# Patient Record
Sex: Male | Born: 1935 | Race: White | Hispanic: No | Marital: Married | Smoking: Never smoker
Health system: Southern US, Community
[De-identification: ages and names within clinical notes are randomized; demographics above are authoritative.]

## PROBLEM LIST (undated history)

## (undated) DIAGNOSIS — I495 Sick sinus syndrome: Secondary | ICD-10-CM

## (undated) DIAGNOSIS — I739 Peripheral vascular disease, unspecified: Secondary | ICD-10-CM

## (undated) DIAGNOSIS — I779 Disorder of arteries and arterioles, unspecified: Secondary | ICD-10-CM

## (undated) DIAGNOSIS — E119 Type 2 diabetes mellitus without complications: Secondary | ICD-10-CM

## (undated) DIAGNOSIS — M1990 Unspecified osteoarthritis, unspecified site: Secondary | ICD-10-CM

## (undated) DIAGNOSIS — I219 Acute myocardial infarction, unspecified: Secondary | ICD-10-CM

## (undated) DIAGNOSIS — I639 Cerebral infarction, unspecified: Secondary | ICD-10-CM

## (undated) DIAGNOSIS — I2510 Atherosclerotic heart disease of native coronary artery without angina pectoris: Secondary | ICD-10-CM

## (undated) DIAGNOSIS — I10 Essential (primary) hypertension: Secondary | ICD-10-CM

## (undated) DIAGNOSIS — I4891 Unspecified atrial fibrillation: Secondary | ICD-10-CM

## (undated) DIAGNOSIS — N189 Chronic kidney disease, unspecified: Secondary | ICD-10-CM

## (undated) HISTORY — PX: APPENDECTOMY: SHX54

## (undated) HISTORY — DX: Unspecified osteoarthritis, unspecified site: M19.90

## (undated) HISTORY — DX: Type 2 diabetes mellitus without complications: E11.9

## (undated) HISTORY — PX: CHOLECYSTECTOMY: SHX55

## (undated) HISTORY — PX: HERNIA REPAIR: SHX51

## (undated) HISTORY — DX: Essential (primary) hypertension: I10

## (undated) HISTORY — DX: Sick sinus syndrome: I49.5

## (undated) HISTORY — DX: Chronic kidney disease, unspecified: N18.9

## (undated) HISTORY — DX: Cerebral infarction, unspecified: I63.9

## (undated) HISTORY — DX: Disorder of arteries and arterioles, unspecified: I77.9

## (undated) HISTORY — DX: Unspecified atrial fibrillation: I48.91

## (undated) HISTORY — DX: Atherosclerotic heart disease of native coronary artery without angina pectoris: I25.10

## (undated) HISTORY — DX: Acute myocardial infarction, unspecified: I21.9

## (undated) HISTORY — DX: Peripheral vascular disease, unspecified: I73.9

---

## 1998-06-16 ENCOUNTER — Ambulatory Visit (HOSPITAL_BASED_OUTPATIENT_CLINIC_OR_DEPARTMENT_OTHER): Admission: RE | Admit: 1998-06-16 | Discharge: 1998-06-16 | Payer: Self-pay | Admitting: Orthopedic Surgery

## 1999-01-18 HISTORY — PX: CORONARY ARTERY BYPASS GRAFT: SHX141

## 2007-10-30 ENCOUNTER — Ambulatory Visit: Payer: Self-pay | Admitting: Cardiology

## 2008-05-19 ENCOUNTER — Ambulatory Visit: Payer: Self-pay | Admitting: Cardiology

## 2008-10-20 DIAGNOSIS — I4891 Unspecified atrial fibrillation: Secondary | ICD-10-CM

## 2008-10-20 DIAGNOSIS — I2510 Atherosclerotic heart disease of native coronary artery without angina pectoris: Secondary | ICD-10-CM | POA: Insufficient documentation

## 2008-10-20 DIAGNOSIS — E785 Hyperlipidemia, unspecified: Secondary | ICD-10-CM | POA: Insufficient documentation

## 2009-01-17 HISTORY — PX: PACEMAKER PLACEMENT: SHX43

## 2009-04-29 ENCOUNTER — Encounter: Payer: Self-pay | Admitting: Cardiology

## 2009-04-29 ENCOUNTER — Ambulatory Visit: Payer: Self-pay | Admitting: Cardiology

## 2009-04-29 ENCOUNTER — Inpatient Hospital Stay (HOSPITAL_COMMUNITY): Admission: EM | Admit: 2009-04-29 | Discharge: 2009-05-02 | Payer: Self-pay | Admitting: Internal Medicine

## 2009-04-30 ENCOUNTER — Encounter: Payer: Self-pay | Admitting: Cardiology

## 2009-05-02 ENCOUNTER — Encounter: Payer: Self-pay | Admitting: Internal Medicine

## 2009-05-05 ENCOUNTER — Encounter: Payer: Self-pay | Admitting: Internal Medicine

## 2009-05-06 ENCOUNTER — Telehealth: Payer: Self-pay | Admitting: Internal Medicine

## 2009-05-12 ENCOUNTER — Telehealth (INDEPENDENT_AMBULATORY_CARE_PROVIDER_SITE_OTHER): Payer: Self-pay | Admitting: *Deleted

## 2009-05-13 ENCOUNTER — Encounter: Payer: Self-pay | Admitting: Internal Medicine

## 2009-05-13 ENCOUNTER — Ambulatory Visit: Payer: Self-pay

## 2009-06-12 ENCOUNTER — Ambulatory Visit: Payer: Self-pay | Admitting: Cardiology

## 2009-06-12 DIAGNOSIS — R0989 Other specified symptoms and signs involving the circulatory and respiratory systems: Secondary | ICD-10-CM

## 2009-06-18 ENCOUNTER — Encounter: Payer: Self-pay | Admitting: Physician Assistant

## 2009-06-23 DIAGNOSIS — I6529 Occlusion and stenosis of unspecified carotid artery: Secondary | ICD-10-CM

## 2009-06-29 ENCOUNTER — Ambulatory Visit: Payer: Self-pay | Admitting: Vascular Surgery

## 2009-06-29 ENCOUNTER — Encounter: Payer: Self-pay | Admitting: Physician Assistant

## 2009-09-14 ENCOUNTER — Telehealth (INDEPENDENT_AMBULATORY_CARE_PROVIDER_SITE_OTHER): Payer: Self-pay | Admitting: *Deleted

## 2009-09-18 ENCOUNTER — Ambulatory Visit: Payer: Self-pay | Admitting: Internal Medicine

## 2009-09-18 DIAGNOSIS — I10 Essential (primary) hypertension: Secondary | ICD-10-CM

## 2009-09-18 DIAGNOSIS — I495 Sick sinus syndrome: Secondary | ICD-10-CM

## 2009-09-24 ENCOUNTER — Ambulatory Visit: Payer: Self-pay | Admitting: Cardiology

## 2009-11-15 ENCOUNTER — Encounter: Payer: Self-pay | Admitting: Cardiology

## 2009-11-16 ENCOUNTER — Ambulatory Visit: Payer: Self-pay | Admitting: Cardiology

## 2010-02-03 ENCOUNTER — Telehealth: Payer: Self-pay | Admitting: Cardiology

## 2010-02-04 ENCOUNTER — Encounter: Payer: Self-pay | Admitting: Internal Medicine

## 2010-02-04 ENCOUNTER — Inpatient Hospital Stay (HOSPITAL_COMMUNITY)
Admission: AD | Admit: 2010-02-04 | Discharge: 2010-02-10 | Disposition: A | Discharge status: Rehabilitation Facility | Payer: Self-pay | Attending: Internal Medicine | Admitting: Internal Medicine

## 2010-02-08 LAB — GLUCOSE, CAPILLARY
Glucose-Capillary: 138 mg/dL — ABNORMAL HIGH (ref 70–99)
Glucose-Capillary: 127 mg/dL — ABNORMAL HIGH (ref 70–99)
Glucose-Capillary: 226 mg/dL — ABNORMAL HIGH (ref 70–99)
Glucose-Capillary: 231 mg/dL — ABNORMAL HIGH (ref 70–99)
Glucose-Capillary: 307 mg/dL — ABNORMAL HIGH (ref 70–99)

## 2010-02-08 LAB — PROTIME-INR
INR: 1.09 (ref 0.00–1.49)
INR: 1.21 (ref 0.00–1.49)
Prothrombin Time: 14.3 seconds (ref 11.6–15.2)
Prothrombin Time: 15.5 seconds — ABNORMAL HIGH (ref 11.6–15.2)

## 2010-02-08 LAB — CBC
HCT: 36.8 % — ABNORMAL LOW (ref 39.0–52.0)
MCV: 89.3 fL (ref 78.0–100.0)
Platelets: 166 10*3/uL (ref 150–400)
RBC: 4.12 MIL/uL — ABNORMAL LOW (ref 4.22–5.81)
WBC: 9.1 10*3/uL (ref 4.0–10.5)

## 2010-02-08 LAB — LIPID PANEL
Cholesterol: 122 mg/dL (ref 0–200)
LDL Cholesterol: 63 mg/dL (ref 0–99)

## 2010-02-08 LAB — HEMOGLOBIN A1C: Hgb A1c MFr Bld: 9.3 % — ABNORMAL HIGH (ref <5.7)

## 2010-02-08 LAB — CARDIAC PANEL(CRET KIN+CKTOT+MB+TROPI): CK, MB: 2.9 ng/mL (ref 0.3–4.0)

## 2010-02-08 LAB — COMPREHENSIVE METABOLIC PANEL
CO2: 23 mEq/L (ref 19–32)
Calcium: 8.7 mg/dL (ref 8.4–10.5)
Chloride: 101 mEq/L (ref 96–112)
Creatinine, Ser: 1.36 mg/dL (ref 0.4–1.5)
GFR calc Af Amer: >60 mL/min (ref >60)
Potassium: 3.9 mEq/L (ref 3.5–5.1)

## 2010-02-09 LAB — GLUCOSE, CAPILLARY
Glucose-Capillary: 202 mg/dL — ABNORMAL HIGH (ref 70–99)
Glucose-Capillary: 216 mg/dL — ABNORMAL HIGH (ref 70–99)
Glucose-Capillary: 221 mg/dL — ABNORMAL HIGH (ref 70–99)
Glucose-Capillary: 363 mg/dL — ABNORMAL HIGH (ref 70–99)
Glucose-Capillary: 273 mg/dL — ABNORMAL HIGH (ref 70–99)
Glucose-Capillary: 271 mg/dL — ABNORMAL HIGH (ref 70–99)

## 2010-02-09 LAB — BASIC METABOLIC PANEL
CO2: 26 mEq/L (ref 19–32)
GFR calc Af Amer: >60 mL/min (ref >60)
GFR calc non Af Amer: 54 mL/min — ABNORMAL LOW (ref >60)
Glucose, Bld: 182 mg/dL — ABNORMAL HIGH (ref 70–99)
Potassium: 4.3 mEq/L (ref 3.5–5.1)
Sodium: 135 mEq/L (ref 135–145)

## 2010-02-09 LAB — PROTIME-INR: Prothrombin Time: 15.4 seconds — ABNORMAL HIGH (ref 11.6–15.2)

## 2010-02-10 ENCOUNTER — Inpatient Hospital Stay (HOSPITAL_COMMUNITY)
Admission: RE | Admit: 2010-02-10 | Discharge: 2010-02-25 | DRG: 945 | Disposition: A | Discharge status: Rehabilitation Facility | Payer: Medicare (Managed Care) | Source: Other Acute Inpatient Hospital | Attending: Physical Medicine & Rehabilitation | Admitting: Physical Medicine & Rehabilitation

## 2010-02-10 DIAGNOSIS — E119 Type 2 diabetes mellitus without complications: Secondary | ICD-10-CM | POA: Diagnosis present

## 2010-02-10 DIAGNOSIS — Z5189 Encounter for other specified aftercare: Principal | ICD-10-CM

## 2010-02-10 DIAGNOSIS — G8190 Hemiplegia, unspecified affecting unspecified side: Secondary | ICD-10-CM | POA: Diagnosis present

## 2010-02-10 DIAGNOSIS — I4891 Unspecified atrial fibrillation: Secondary | ICD-10-CM | POA: Diagnosis present

## 2010-02-10 DIAGNOSIS — I2510 Atherosclerotic heart disease of native coronary artery without angina pectoris: Secondary | ICD-10-CM | POA: Diagnosis present

## 2010-02-10 DIAGNOSIS — E785 Hyperlipidemia, unspecified: Secondary | ICD-10-CM | POA: Diagnosis present

## 2010-02-10 DIAGNOSIS — I6350 Cerebral infarction due to unspecified occlusion or stenosis of unspecified cerebral artery: Secondary | ICD-10-CM | POA: Diagnosis present

## 2010-02-10 DIAGNOSIS — R4701 Aphasia: Secondary | ICD-10-CM | POA: Diagnosis present

## 2010-02-10 DIAGNOSIS — Z951 Presence of aortocoronary bypass graft: Secondary | ICD-10-CM

## 2010-02-10 LAB — BASIC METABOLIC PANEL
BUN: 21 mg/dL (ref 6–23)
CO2: 26 mEq/L (ref 19–32)
Calcium: 8.7 mg/dL (ref 8.4–10.5)
Chloride: 103 mEq/L (ref 96–112)
Creatinine, Ser: 1.45 mg/dL (ref 0.4–1.5)
GFR calc Af Amer: 56 mL/min — ABNORMAL LOW (ref >60)

## 2010-02-10 LAB — GLUCOSE, CAPILLARY
Glucose-Capillary: 351 mg/dL — ABNORMAL HIGH (ref 70–99)
Glucose-Capillary: 180 mg/dL — ABNORMAL HIGH (ref 70–99)
Glucose-Capillary: 233 mg/dL — ABNORMAL HIGH (ref 70–99)

## 2010-02-10 LAB — PROTIME-INR: Prothrombin Time: 18.3 seconds — ABNORMAL HIGH (ref 11.6–15.2)

## 2010-02-11 LAB — CBC
HCT: 38.3 % — ABNORMAL LOW (ref 39.0–52.0)
MCV: 89.5 fL (ref 78.0–100.0)
Platelets: 234 10*3/uL (ref 150–400)
RBC: 4.28 MIL/uL (ref 4.22–5.81)
WBC: 7.5 10*3/uL (ref 4.0–10.5)

## 2010-02-11 LAB — COMPREHENSIVE METABOLIC PANEL
Albumin: 3.2 g/dL — ABNORMAL LOW (ref 3.5–5.2)
Alkaline Phosphatase: 54 U/L (ref 39–117)
BUN: 23 mg/dL (ref 6–23)
Chloride: 97 mEq/L (ref 96–112)
Potassium: 4.4 mEq/L (ref 3.5–5.1)
Total Bilirubin: 0.6 mg/dL (ref 0.3–1.2)

## 2010-02-11 LAB — PROTIME-INR: INR: 1.73 — ABNORMAL HIGH (ref 0.00–1.49)

## 2010-02-11 LAB — DIFFERENTIAL
Lymphocytes Relative: 19 % (ref 12–46)
Lymphs Abs: 1.4 10*3/uL (ref 0.7–4.0)
Neutrophils Relative %: 67 % (ref 43–77)

## 2010-02-11 LAB — GLUCOSE, CAPILLARY: Glucose-Capillary: 312 mg/dL — ABNORMAL HIGH (ref 70–99)

## 2010-02-12 LAB — GLUCOSE, CAPILLARY
Glucose-Capillary: 451 mg/dL — ABNORMAL HIGH (ref 70–99)
Glucose-Capillary: 475 mg/dL — ABNORMAL HIGH (ref 70–99)
Glucose-Capillary: 123 mg/dL — ABNORMAL HIGH (ref 70–99)

## 2010-02-13 LAB — GLUCOSE, CAPILLARY
Glucose-Capillary: 124 mg/dL — ABNORMAL HIGH (ref 70–99)
Glucose-Capillary: 219 mg/dL — ABNORMAL HIGH (ref 70–99)
Glucose-Capillary: 142 mg/dL — ABNORMAL HIGH (ref 70–99)

## 2010-02-13 LAB — PROTIME-INR: Prothrombin Time: 25.8 seconds — ABNORMAL HIGH (ref 11.6–15.2)

## 2010-02-14 LAB — PROTIME-INR: INR: 2.06 — ABNORMAL HIGH (ref 0.00–1.49)

## 2010-02-14 LAB — GLUCOSE, CAPILLARY
Glucose-Capillary: 247 mg/dL — ABNORMAL HIGH (ref 70–99)
Glucose-Capillary: 275 mg/dL — ABNORMAL HIGH (ref 70–99)

## 2010-02-15 LAB — PROTIME-INR: INR: 2.41 — ABNORMAL HIGH (ref 0.00–1.49)

## 2010-02-15 LAB — GLUCOSE, CAPILLARY: Glucose-Capillary: 292 mg/dL — ABNORMAL HIGH (ref 70–99)

## 2010-02-16 ENCOUNTER — Ambulatory Visit: Admit: 2010-02-16 | Payer: Self-pay | Admitting: Vascular Surgery

## 2010-02-16 LAB — BASIC METABOLIC PANEL
BUN: 31 mg/dL — ABNORMAL HIGH (ref 6–23)
Calcium: 9 mg/dL (ref 8.4–10.5)
Creatinine, Ser: 1.65 mg/dL — ABNORMAL HIGH (ref 0.4–1.5)
GFR calc Af Amer: 48 mL/min — ABNORMAL LOW (ref >60)
GFR calc non Af Amer: 40 mL/min — ABNORMAL LOW (ref >60)

## 2010-02-16 LAB — PROTIME-INR
INR: 2.47 — ABNORMAL HIGH (ref 0.00–1.49)
Prothrombin Time: 26.9 seconds — ABNORMAL HIGH (ref 11.6–15.2)

## 2010-02-16 LAB — GLUCOSE, CAPILLARY: Glucose-Capillary: 98 mg/dL (ref 70–99)

## 2010-02-16 NOTE — Miscellaneous (Signed)
Summary: Device preload  Clinical Lists Changes  Observations: Added new observation of PPM INDICATN: A-fib (05/05/2009 16:41) Added new observation of MAGNET RTE: BOL 100 ERI  85 (05/05/2009 16:41) Added new observation of PPMLEADSTAT1: active (05/05/2009 16:41) Added new observation of PPMLEADSER1: [id number] (05/05/2009 16:41) Added new observation of PPMLEADMOD1: 1888TC (05/05/2009 16:41) Added new observation of PPMLEADLOC1: RV (05/05/2009 16:41) Added new observation of PPM IMP MD: Hillis Range, MD (05/05/2009 16:41) Added new observation of PPMLEADDOI1: 05/01/2009 (05/05/2009 16:41) Added new observation of PPM DOI: 05/01/2009 (05/05/2009 16:41) Added new observation of PPM SERL#: [id number]  (05/05/2009 16:41) Added new observation of PPM MODL#: [id number]  (05/05/2009 56:21) Added new observation of PACEMAKERMFG: [employer] Jude  (05/05/2009 16:41) Added new observation of PACEMAKER MD: Hillis Range, MD  (05/05/2009 16:41)      PPM Specifications Following MD:  Hillis Range, MD     PPM Vendor:  [employer] Jude     PPM Model Number:  [id number]     PPM Serial Number:  [id number] PPM DOI:  05/01/2009     PPM Implanting MD:  Hillis Range, MD  Lead 1    Location: RV     DOI: 05/01/2009     Model #: [id number]     Serial #: [id number]     Status: active  Magnet Response Rate:  BOL 100 ERI  85  Indications:  A-fib

## 2010-02-16 NOTE — Assessment & Plan Note (Signed)
Summary: 6 MO FU PER AUG REMINDER   Visit Type:  Follow-up Primary Provider:  Dr. Wyvonnia Lora   History of Present Illness: 77 year old male presents for followup. He was seen for routine followup in May of this year. Most recently the patient had a device followup with Dr. Johney Frame in September at which time atrial fibrillation was noted, rate controlled. Discussion regarding anticoagulation with had at that point with CHADS2 score of 3, and the patient declined Coumadin. Otherwise pacemaker function was stable.  He denies any problems with chest pain or unusual breathlessness. Continues to enjoy outdoors activity in his garden.  Simvastatin was cut back to 40 mg daily based on FDA recommendations, and also with some complaints of calf discomfort. He perhaps has had some improvement, although it is still early after this adjustment.  No sense of palpitations. He had no further questions regarding atrial fibrillation and anticoagulation. He does not want to start Coumadin.   Clinical Review Panels:  Cardiac Imaging Cardiac Cath Findings  ANGIOGRAPHIC DATA:   1. The left main is free of critical disease.   2. The LAD is somewhat diffusely diseased and then appears to be       severely diseased at the bifurcation of the small diagonal and then       the LAD.  No further distal vessel was seen.  This is after a       septal perforator.   3. The internal mammary to the distal LAD is in fact intact.  The       distal LAD is a very small-caliber vessel, diffusely diseased, and       does appear to provide flow to the posterior descending.  It is a       very small caliber diffusely diseased vessel.   4. The circumflex is a moderately large vessel.  There is a tiny first       marginal that has probably 50-70% proximal segmental plaque but is       small in caliber.  Following this, the vessel then opens up and       provides a bifurcating marginal with a very small-caliber vessel       and  a subbranch with about 90% narrowing.  The third marginal is       essentially occluded.   5. The saphenous vein graft what appears to be the third marginal is       intact.  It was described as a diagonal OM graft, and the diagonal       takeoff is not seen.  The actual diagonal may represent the       diagonal seen on the native left injection, but this is unknown       since the old films are not presently available.   6. The right coronary artery is somewhat diffusely diseased vessel       with 50% narrowing and then small calibered distally going into the       posterolateral segment which is extremely small and subtotally       occluded.  The PDA itself is not seen.  We believe we see the PDA       slowly filling in by retrograde collaterals from the distal LAD       injection.   7. Ventriculography in the RAO projection reveals vigorous global       systolic function with ejection fraction in excess of 60%.   8. Aortic  root aortography reveals no aortic regurgitation.  The       aortic size may be minimally dilated but appears overall to be at       most upper normal in size. (05/01/2009)    Current Medications (verified): 1)  Coreg 3.125 Mg Tabs (Carvedilol) .... Take 1 Tablet By Mouth Twice A Day 2)  Nitrostat 0.4 Mg Subl (Nitroglycerin) .Marland Kitchen.. 1 Tablet Under Tongue At Onset of Chest Pain; You May Repeat Every 5 Minutes For Up To 3 Doses. 3)  Aspirin Ec 325 Mg Tbec (Aspirin) .... Take One Tablet By Mouth Daily 4)  Glipizide 5 Mg Tabs (Glipizide) .... Take 1 Tablet By Mouth Once A Day 5)  Lisinopril 20 Mg Tabs (Lisinopril) .... Take 1 Tablet By Mouth Once A Day 6)  Nifedipine 30 Mg Xr24h-Tab (Nifedipine) .... Take 1 Tablet By Mouth Once A Day 7)  Simvastatin 40 Mg Tabs (Simvastatin) .... Take One Tablet By Mouth Daily At Bedtime  Allergies (verified): 1)  ! * Plavix  Past History:  Past Medical History: Last updated: 06/08/2009 CAD - multivessel, LVEF  60% Hypertension Myocardial Infarction - NSTEMI Atrial Fibrillation - refuses Coumadin Hearing loss Diabetes Type 2 Sick sinus syndrome - s/p PPM  Past Surgical History: Last updated: 06/08/2009 Hernia repair Appendectomy CABG 2001 - LIMA to LAD, SVG to OM, SVG to PDA Cholecystectomy  [facility] [employer] RF SR model [phone] - 4/11  Social History: Last updated: 06/08/2009 Married  Tobacco Use - No Alcohol Use - no Regular Exercise - yes Drug Use - no Full Time - minister/ evangelist  Review of Systems  The patient denies anorexia, fever, chest pain, syncope, dyspnea on exertion, peripheral edema, melena, and hematochezia.         Otherwise reviewed and negative except as outlined.  Vital Signs:  Patient profile:   77 year old male Height:      66 inches Weight:      160 pounds BMI:     25.92 Pulse rate:   61 / minute Resp:     16 per minute BP sitting:   175 / 83  (left arm)  Vitals Entered By: Marrion Coy, CNA (September 24, 2009 11:18 AM)  Physical Exam  Additional Exam:  GEN:77 year old male, sitting upright, in no distress HEENT: NCAT,PERRLA,EOMI NECK: palpable pulses; high pitched left sided bruit; no JVD; no TM LUNGS: CTA bilaterally HEART: RRR (S1S2); no significant murmurs; no rubs; no gallops ABD: soft, NT; intact BS EXT: intact right femoral pulse, no bruit; trace pedal edema SKIN: warm, dry MUSC: no obvious deformity NEURO: A/O (x3)    PPM Specifications Following MD:  Hillis Range, MD     PPM Vendor:  [employer] Jude     PPM Model Number:  [id number]     PPM Serial Number:  [id number] PPM DOI:  05/01/2009     PPM Implanting MD:  Hillis Range, MD  Lead 1    Location: RV     DOI: 05/01/2009     Model #: [id number]     Serial #: [id number]     Status: active  Magnet Response Rate:  BOL 100 ERI  85  Indications:  A-fib   PPM Follow Up Pacer Dependent:  No      Parameters Mode:  VVIR     Lower Rate Limit:  60     Upper Rate Limit:  110  Impression &  Recommendations:  Problem # 1:  CAD, NATIVE VESSEL (ICD-414.01)  Symptomatically stable on medical therapy. Most recent angiogram is reviewed above. Plan to continue observation and arrange followup in 6 months.  His updated medication list for this problem includes:    Coreg 3.125 Mg Tabs (Carvedilol) .Marland Kitchen... Take 1 tablet by mouth twice a day    Nitrostat 0.4 Mg Subl (Nitroglycerin) .Marland Kitchen... 1 tablet under tongue at onset of chest pain; you may repeat every 5 minutes for up to 3 doses.    Aspirin Ec 325 Mg Tbec (Aspirin) .Marland Kitchen... Take one tablet by mouth daily    Lisinopril 20 Mg Tabs (Lisinopril) .Marland Kitchen... Take 1 tablet by mouth once a day    Nifedipine 30 Mg Xr24h-tab (Nifedipine) .Marland Kitchen... Take 1 tablet by mouth once a day  Problem # 2:  ATRIAL FIBRILLATION (ICD-427.31)  As noted above, patient does not want to initiate anticoagulation therapy. He continues on aspirin.  His updated medication list for this problem includes:    Coreg 3.125 Mg Tabs (Carvedilol) .Marland Kitchen... Take 1 tablet by mouth twice a day    Aspirin Ec 325 Mg Tbec (Aspirin) .Marland Kitchen... Take one tablet by mouth daily  Problem # 3:  HYPERLIPIDEMIA-MIXED (ICD-272.4)  Patient on lower dose simvastatin. Would recommend a followup fasting lipid profile and liver function tests over the next 8-12 weeks in follow up with Dr. Margo Common. If LDL is suboptimally controlled, may need to consider switching to a different statin medication.  His updated medication list for this problem includes:    Simvastatin 40 Mg Tabs (Simvastatin) .Marland Kitchen... Take one tablet by mouth daily at bedtime  Problem # 4:  ESSENTIAL HYPERTENSION, BENIGN (ICD-401.1)  Blood pressure elevated today. Due for follow up with Dr. Margo Common this month. May need further medication adjustments.  His updated medication list for this problem includes:    Coreg 3.125 Mg Tabs (Carvedilol) .Marland Kitchen... Take 1 tablet by mouth twice a day    Aspirin Ec 325 Mg Tbec (Aspirin) .Marland Kitchen... Take one tablet by mouth  daily    Lisinopril 20 Mg Tabs (Lisinopril) .Marland Kitchen... Take 1 tablet by mouth once a day    Nifedipine 30 Mg Xr24h-tab (Nifedipine) .Marland Kitchen... Take 1 tablet by mouth once a day  Patient Instructions: 1)  Your physician wants you to follow-up in: 6 months. You will receive a reminder letter in the mail one-two months in advance. If you don't receive a letter, please call our office to schedule the follow-up appointment. 2)  Your physician recommends that you continue on your current medications as directed. Please refer to the Current Medication list given to you today.

## 2010-02-16 NOTE — Progress Notes (Signed)
Summary: ? plavix reaction   Phone Note Other Incoming   Caller: wife - Debarah Crape  Summary of Call: Concerned that husband may be having reaction to Plavix.  Started on 4/23 - Plavix.  Since then blurry vision - mostly in afternoon, slight confusion, weakness in the legs, and dizziness.  Blood sugars are running 150-170.  150 is pretty average for him.  BP 150/70.  HR 60.  Please advise.  Hoover Brunette, LPN  May 12, 2009 10:31 AM   Follow-up for Phone Call        I have not seen patient recently.  I reviewed the recent hospital stay, catheterization report, and pacemaker report.  He did not undergo intervention.  Plavix apparently being used for minor NSTEMI with mulitivessel CAD.  Patient call to [facility] office also noted regarding rash on buttocks.  Not clear that these issue are related to Plavix, however since no PCI and this is only new medication, would stop it for now.  I presume he has an office visit scheduled - check on this please. Follow-up by: Loreli Slot, MD, [facility],  May 12, 2009 10:44 AM  Additional Follow-up for Phone Call Additional follow up Details #1::        Pt's wife notified to stop Plavix for now. Pt was scheduled follow up appt on Tuesday, May 3rd at 3:40, but pt's wife called to reschedule today b/c she won't be able to be with him this day. Appt is now scheduled for May 27th at 3pm. Additional Follow-up by: Cyril Loosen, RN, BSN,  May 12, 2009 4:50 PM

## 2010-02-16 NOTE — Progress Notes (Signed)
Summary: Concerned about BP  Phone Note Call from Patient Call back at Home Phone [phone]   Summary of Call: Male called for pt stating he was working in the yard. He came inside and he c/o weakness. She states his blood pressure ranged from 107-119/45-56. After resting his blood pressure was 128/65. He has a pacemaker so his HR remained around 60. He feels better now per caller. Notified that lower BP may be a result of just coming inside after working out in the heat. Notified to monitor BP and notify office if significantly lower BP with symptoms. Caller verbalized understanding. Initial call taken by: Cyril Loosen, RN, BSN,  September 14, 2009 4:07 PM

## 2010-02-16 NOTE — Assessment & Plan Note (Signed)
Summary: 3 MO F/U PER 4/27 WOUND CHECK-JM   Visit Type:  Pacemaker check Primary Provider:  Dr. Wyvonnia Lora   History of Present Illness: The patient presents today for routine electrophysiology followup. He reports doing very well since last being seen in our clinic. The patient denies symptoms of palpitations, chest pain, shortness of breath, orthopnea, PND, lower extremity edema, dizziness, presyncope, syncope, or neurologic sequela. The patient is tolerating medications without difficulties and is otherwise without complaint today.   Preventive Screening-Counseling & Management  Alcohol-Tobacco     Smoking Status: never  Current Medications (verified): 1)  Coreg 3.125 Mg Tabs (Carvedilol) .... Take 1 Tablet By Mouth Twice A Day 2)  Nitrostat 0.4 Mg Subl (Nitroglycerin) .Marland Kitchen.. 1 Tablet Under Tongue At Onset of Chest Pain; You May Repeat Every 5 Minutes For Up To 3 Doses. 3)  Aspirin Ec 325 Mg Tbec (Aspirin) .... Take One Tablet By Mouth Daily 4)  Glipizide 5 Mg Tabs (Glipizide) .... Take 1 Tablet By Mouth Once A Day 5)  Lisinopril 20 Mg Tabs (Lisinopril) .... Take 1 Tablet By Mouth Once A Day 6)  Nifedipine 30 Mg Xr24h-Tab (Nifedipine) .... Take 1 Tablet By Mouth Once A Day 7)  Simvastatin 80 Mg Tabs (Simvastatin) .... Take 1 Tablet By Mouth Once A Day  Allergies: 1)  ! * Plavix  Comments:  Nurse/Medical Assistant: The patient is currently on medications but does not know the name or dosage at this time. Instructed to contact our office with details. Will update medication list at that time.  Past History:  Past Medical History: Reviewed history from 06/08/2009 and no changes required. CAD - multivessel, LVEF 60% Hypertension Myocardial Infarction - NSTEMI Atrial Fibrillation - refuses Coumadin Hearing loss Diabetes Type 2 Sick sinus syndrome - s/p PPM  Past Surgical History: Reviewed history from 06/08/2009 and no changes required. Hernia repair Appendectomy CABG  2001 - LIMA to LAD, SVG to OM, SVG to PDA Cholecystectomy [facility] [employer] RF SR model [id number] - 4/11  Social History: Reviewed history from 06/08/2009 and no changes required. Married  Tobacco Use - No Alcohol Use - no Regular Exercise - yes Drug Use - no Full Time - minister/ evangelist  Review of Systems       All systems are reviewed and negative except as listed in the HPI.   Vital Signs:  Patient profile:   77 year old male Height:      66 inches Weight:      163 pounds Pulse rate:   65 / minute BP sitting:   177 / 94  (left arm) Cuff size:   regular  Vitals Entered By: Carlye Grippe (September 18, 2009 1:22 PM)  Physical Exam  General:  Well developed, well nourished, in no acute distress. Head:  normocephalic and atraumatic Eyes:  PERRLA/EOM intact; conjunctiva and lids normal. Mouth:  Teeth, gums and palate normal. Oral mucosa normal. Neck:  supple Chest Wall:  pacemaker site is well healed Lungs:  Clear bilaterally to auscultation and percussion. Heart:  RRR(paced) Abdomen:  Bowel sounds positive; abdomen soft and non-tender without masses, organomegaly, or hernias noted. No hepatosplenomegaly. Msk:  Back normal, normal gait. Muscle strength and tone normal. Pulses:  pulses normal in all 4 extremities Extremities:  No clubbing or cyanosis. Neurologic:  Alert and oriented x 3.   PPM Specifications Following MD:  Hillis Range, MD     PPM Vendor:  [employer] Jude     PPM Model Number:  [id number]     PPM Serial Number:  [id number] PPM DOI:  05/01/2009     PPM Implanting MD:  Hillis Range, MD  Lead 1    Location: RV     DOI: 05/01/2009     Model #: [id number]     Serial #: [id number]     Status: active  Magnet Response Rate:  BOL 100 ERI  85  Indications:  A-fib   PPM Follow Up Battery Voltage:  2.98 V     Battery Est. Longevity:  11.0-12.6 yrs     Pacer Dependent:  No     Right Ventricle  Amplitude: 12.0 mV, Impedance: 450 ohms, Threshold: 0.5 V at 0.4  msec  Episodes MS Episodes:  0     Ventricular High Rate:  0     Ventricular Pacing:  83%  Parameters Mode:  VVIR     Lower Rate Limit:  60     Upper Rate Limit:  110 Next Cardiology Appt Due:  03/18/2010 Tech Comments:  NORMAL DEVICE FUNCTION.  NO EPISODES SINCE LAST CHECK.  NO CHANGES MADE. ROV IN 6 MTHS W/DEVICE CLINIC. PT UNABLE TO BE ENROLLED IN [place] --NO LANDLINE PHONE.  Vella Kohler  September 18, 2009 1:33 PM MD Comments:  agree  Impression & Recommendations:  Problem # 1:  ATRIAL FIBRILLATION (ICD-427.31) stable, rate controlled I had a long discussion with the patient today regarding his elevated stroke risks and indications for Coumadin vs pradaxa (his CHADS2 score is 3).  He has taken coumadin previously.  He understands his risks and is very clear in his decision to decline coumadin at this time. Continue ASA 325mg  daily.  Problem # 2:  HYPERLIPIDEMIA-MIXED (ICD-272.4) per FDAs recommendation, we will decrease simvastatin to 40mg  qhs  Problem # 3:  CAD, NATIVE VESSEL (ICD-414.01) no symptoms of ischemia  Problem # 4:  BRADYCARDIA (ICD-427.89) improved s/p PPM changes as above  Problem # 5:  ESSENTIAL HYPERTENSION, BENIGN (ICD-401.1) above goal declines medicine changes today salt restriction advised  Patient Instructions: 1)  Decrease simvastatin to 40mg  by mouth at bedtime.  2)  Your physician wants you to follow-up in: 6 months. You will receive a reminder letter in the mail one-two months in advance. If you don't receive a letter, please call our office to schedule the follow-up appointment.

## 2010-02-16 NOTE — Procedures (Signed)
Summary: wch/per spouse call/lg   PPM Specifications Following MD:  Hillis Range, MD     PPM Vendor:  [employer] Jude     PPM Model Number:  [id number]     PPM Serial Number:  [id number] PPM DOI:  05/01/2009     PPM Implanting MD:  Hillis Range, MD  Lead 1    Location: RV     DOI: 05/01/2009     Model #: 1888TC     Serial #: [id number]     Status: active  Magnet Response Rate:  BOL 100 ERI  85  Indications:  A-fib   PPM Follow Up Remote Check?  No Battery Voltage:  2.99 V     Battery Est. Longevity:  7.2-7.9 YRS     Pacer Dependent:  No     Right Ventricle  Amplitude: 12.0 mV, Impedance: 410 ohms, Threshold: 0.5 V at 0.4 msec  Episodes MS Episodes:  0     Ventricular High Rate:  0     Ventricular Pacing:  96%  Parameters Mode:  VVIR     Lower Rate Limit:  60     Upper Rate Limit:  110 Next Cardiology Appt Due:  08/14/2009 Tech Comments:  NORMAL DEVICE FUNCTION.  CHANGES: TURNED ON V AUTOCAPTURE AND ACTIVITY THRESHOLD  FROM AUTO (+0.0) TO AUTO (-0.5).  ROV W/DR Bronsyn Shappell IN [place]. Gypsy Balsam RN BSN  May 13, 2009 3:45 PM

## 2010-02-16 NOTE — Cardiovascular Report (Signed)
Summary: [facility]/ [facility] SUMMARY  [facility]/ [facility] SUMMARY   Imported By: Dorise Hiss 09/24/2009 10:27:11  _____________________________________________________________________  External Attachment:    Type:   Image     Comment:   External Document

## 2010-02-16 NOTE — Assessment & Plan Note (Signed)
Summary: weakness, sob  --agh   Visit Type:  Follow-up Primary Provider:  Dr. Wyvonnia Lora   History of Present Illness: 77 year old male presents for follow-up. He was admitted to [facility] in April with unstable angina/NSTEMI and symptomatic bradycardia. Cardiac catherterization is noted below, and medical therapy was recommended. He was seen by Dr. Johney Frame with findings of PAF and SSS resulting in pacemaker placement. Plavix was continued, the patient refusing to consider Coumadin (as also noted in the past).  Shortly following his release, the patient developed a significant rash. Dr. Diona Browner was apprised of this, and recommendation was to discontinue Plavix. According to the patient's wife, this resulted in complete resolution of the rash. He remains on low dose aspirin.  The patient has had scheduled followup with our pacer clinic in [place], for initial wound check. This apparently has healed nicely. He is to return here to the [facility] clinic, to follow with Dr. Johney Frame.  Clinically, the patient denies any chest discomfort or significant exertional dyspnea. His wife recalls that he complained of "indigestion" one week prior to his presentation to the emergency room. He has not had any symptoms similar to his initial presentation, here at [facility], when he presented with symptomatic bradycardia.  Clinical Review Panels:  Cardiac Imaging Cardiac Cath Findings  ANGIOGRAPHIC DATA:   1. The left main is free of critical disease.   2. The LAD is somewhat diffusely diseased and then appears to be       severely diseased at the bifurcation of the small diagonal and then       the LAD.  No further distal vessel was seen.  This is after a       septal perforator.   3. The internal mammary to the distal LAD is in fact intact.  The       distal LAD is a very small-caliber vessel, diffusely diseased, and       does appear to provide flow to the posterior descending.  It is a       very  small caliber diffusely diseased vessel.   4. The circumflex is a moderately large vessel.  There is a tiny first       marginal that has probably 50-70% proximal segmental plaque but is       small in caliber.  Following this, the vessel then opens up and       provides a bifurcating marginal with a very small-caliber vessel       and a subbranch with about 90% narrowing.  The third marginal is       essentially occluded.   5. The saphenous vein graft what appears to be the third marginal is       intact.  It was described as a diagonal OM graft, and the diagonal       takeoff is not seen.  The actual diagonal may represent the       diagonal seen on the native left injection, but this is unknown       since the old films are not presently available.   6. The right coronary artery is somewhat diffusely diseased vessel       with 50% narrowing and then small calibered distally going into the       posterolateral segment which is extremely small and subtotally       occluded.  The PDA itself is not seen.  We believe we see the PDA  slowly filling in by retrograde collaterals from the distal LAD       injection.   7. Ventriculography in the RAO projection reveals vigorous global       systolic function with ejection fraction in excess of 60%.   8. Aortic root aortography reveals no aortic regurgitation.  The       aortic size may be minimally dilated but appears overall to be at       most upper normal in size. (05/01/2009)    Preventive Screening-Counseling & Management  Alcohol-Tobacco     Smoking Status: never  Current Medications (verified): 1)  Coreg 3.125 Mg Tabs (Carvedilol) .... Take 1 Tablet By Mouth Twice A Day 2)  Nitrostat 0.4 Mg Subl (Nitroglycerin) .... Use As Directed 3)  Aspir-Low 81 Mg Tbec (Aspirin) .... Take 1 Tablet By Mouth Once A Day 4)  Glipizide 5 Mg Tabs (Glipizide) .... Take 1 Tablet By Mouth Once A Day 5)  Lisinopril 20 Mg Tabs (Lisinopril) .... Take  1 Tablet By Mouth Once A Day 6)  Nifedipine 30 Mg Xr24h-Tab (Nifedipine) .... Take 1 Tablet By Mouth Once A Day 7)  Simvastatin 80 Mg Tabs (Simvastatin) .... Take 1 Tablet By Mouth Once A Day  Allergies (verified): 1)  ! * Plavix  Past History:  Past Medical History: Last updated: 06/08/2009 CAD - multivessel, LVEF 60% Hypertension Myocardial Infarction - NSTEMI Atrial Fibrillation - refuses Coumadin Hearing loss Diabetes Type 2 Sick sinus syndrome - s/p PPM  Past Surgical History: Last updated: 06/08/2009 Hernia repair Appendectomy CABG 2001 - LIMA to LAD, SVG to OM, SVG to PDA Cholecystectomy [facility] Acc[employer] RF SR model [phone] - 4/11  Social History: Last updated: 06/08/2009 Married  Tobacco Use - No Alcohol Use - no Regular Exercise - yes Drug Use - no Full Time - minister/ evangelist  Review of Systems       No fevers, chills, hemoptysis, dysphagia, melena, hematocheezia, hematuria, claudication, orthopnea, pnd, pedal edema. Rash has completely resolved, following discontinuation of Plavix. All other systems negative.   Vital Signs:  Patient profile:   77 year old male Height:      66 inches Weight:      165.25 pounds BMI:     26.77 Pulse rate:   60 / minute BP sitting:   123 / 68  (left arm) Cuff size:   regular  Vitals Entered By: Hoover Brunette, LPN (Jun 12, 2009 2:42 PM)  Nutrition Counseling: Patient's BMI is greater than 25 and therefore counseled on weight management options. Is Patient Diabetic? Yes   Physical Exam  Additional Exam:  GEN:77 year old male, sitting upright, in no distress HEENT: NCAT,PERRLA,EOMI NECK: palpable pulses; high pitched left sided bruit; no JVD; no TM LUNGS: CTA bilaterally HEART: RRR (S1S2); no significant murmurs; no rubs; no gallops ABD: soft, NT; intact BS EXT: intact right femoral pulse, no bruit; trace pedal edema SKIN: warm, dry MUSC: no obvious deformity NEURO: A/O (x3)  heart rate   PPM  Specifications Following MD:  Hillis Range, MD     PPM Vendor:  [employer] Jude     PPM Model Number:  [id number]     PPM Serial Number:  [id number] PPM DOI:  05/01/2009     PPM Implanting MD:  Hillis Range, MD  Lead 1    Location: RV     DOI: 05/01/2009     Model #: [id number]     Serial #: [id number]     Status: active  Magnet Response Rate:  BOL 100 ERI  85  Indications:  A-fib   PPM Follow Up Pacer Dependent:  No      Parameters Mode:  VVIR     Lower Rate Limit:  60     Upper Rate Limit:  110  Impression & Recommendations:  Problem # 1:  CAD, NATIVE VESSEL (ICD-414.01) patient presents for post hospital followup, and denies any symptoms suggestive of unstable angina pectoris. He was recently transferred from [facility] ED to [facility], after presenting with symptomatic bradycardia. He did rule in for non-ST elevation infarction, but was found to have stable coronary anatomy and normal left ventricular function, by coronary angiography. Continued medical therapy was recommended, and he was placed on Plavix. He subsequently developed a significant rash shortly after discharge, however, which resolved completely following discontinuation of this. He remains on low-dose aspirin. We will increase this to 325 mg daily, given his known CAD as well as long-standing history of permanent atrial fibrillation (he has previously refused Coumadin). Will schedule return clinic visit with myself and Dr. Diona Browner in 3 months.  The following medications were removed from the medication list:    Plavix 75 Mg Tabs (Clopidogrel bisulfate) .Marland Kitchen... Take 1 tablet by mouth once a day His updated medication list for this problem includes:    Coreg 3.125 Mg Tabs (Carvedilol) .Marland Kitchen... Take 1 tablet by mouth twice a day    Nitrostat 0.4 Mg Subl (Nitroglycerin) ..... Use as directed    Aspirin Ec 325 Mg Tbec (Aspirin) .Marland Kitchen... Take one tablet by mouth daily    Lisinopril 20 Mg Tabs (Lisinopril) .Marland Kitchen... Take 1 tablet by mouth once a  day    Nifedipine 30 Mg Xr24h-tab (Nifedipine) .Marland Kitchen... Take 1 tablet by mouth once a day  Problem # 2:  ATRIAL FIBRILLATION (ICD-427.31) patient is status post recent placement of a [employer]. Jude dual-chamber pacemaker, for treatment of symptomatic bradycardia. She will return here to follow up Dr. Hillis Range, and approximate 2 months. Patient states that the wound is healing nicely.  The following medications were removed from the medication list:    Plavix 75 Mg Tabs (Clopidogrel bisulfate) .Marland Kitchen... Take 1 tablet by mouth once a day His updated medication list for this problem includes:    Coreg 3.125 Mg Tabs (Carvedilol) .Marland Kitchen... Take 1 tablet by mouth twice a day    Aspirin Ec 325 Mg Tbec (Aspirin) .Marland Kitchen... Take one tablet by mouth daily  Problem # 3:  HYPERLIPIDEMIA-MIXED (ICD-272.4) aggressive medical management recommended with target LDL of 70 or less, if feasible. We'll defer to Dr. Margo Common for continued monitoring and management.  His updated medication list for this problem includes:    Simvastatin 80 Mg Tabs (Simvastatin) .Marland Kitchen... Take 1 tablet by mouth once a day  Problem # 4:  CAROTID BRUIT (ICD-785.9) we'll schedule carotid Dopplers to rule out significant left ICA disease.  Orders: Carotid Duplex (Carotid Duplex)  Patient Instructions: 1)  Your physician wants you to follow-up in: 3 months. You will receive a reminder letter in the mail one-two months in advance. If you don't receive a letter, please call our office to schedule the follow-up appointment. 2)  Follow up with Dr. Johney Frame on Friday September 2nd at 1:30pm. 3)  Increase Aspirin to 325mg  by mouth once daily. 4)  Your physician has requested that you have a carotid duplex. This test is an ultrasound of the carotid arteries in your neck. It looks at blood flow through these arteries that  supply the brain with blood. Allow one hour for this exam. There are no restrictions or special instructions. If the results of your test are  normal or stable, you will receive a letter. If they are abnormal, the nurse will contact you by phone.  Prescriptions: NITROSTAT 0.4 MG SUBL (NITROGLYCERIN) 1 tablet under tongue at onset of chest pain; you may repeat every 5 minutes for up to 3 doses.  #25 x 3   Entered by:   Cyril Loosen, RN, BSN   Authorized by:   Nelida Meuse, PA-C   Signed by:   Cyril Loosen, RN, BSN on 06/12/2009   Method used:   Electronically to        [facility]* (retail)       [address]       [place], [place]  [address]       Ph: [phone]       Fax: [phone]   RxID:   [id number]

## 2010-02-16 NOTE — Cardiovascular Report (Signed)
Summary: Office Visit   Office Visit   Imported By: Roderic Ovens 05/27/2009 13:17:52  _____________________________________________________________________  External Attachment:    Type:   Image     Comment:   External Document

## 2010-02-16 NOTE — Progress Notes (Signed)
Summary: Office Visit/ V V NEW PATIENT CONSULTATION  Office Visit/ V V NEW PATIENT CONSULTATION   Imported By: Dorise Hiss 07/13/2009 11:36:14  _____________________________________________________________________  External Attachment:    Type:   Image     Comment:   External Document

## 2010-02-16 NOTE — Progress Notes (Signed)
Summary: pt may be allergic to plavix   Phone Note Call from Patient Call back at Home Phone [phone]   Caller: Patient Reason for Call: Talk to Nurse, Talk to Doctor Summary of Call: pt seems to be allergic to plavix and wife wants to [facility] what to do pt has a rash on his butt.  Initial call taken by: Omer Jack,  May 06, 2009 9:30 AM  Follow-up for Phone Call        Spoke with pt's wife. Wife states pt. was in [facility] was d/c on 05/02/09 with Plavix 75 mg daily. Pt's wife notice a large and thick rash on both of pt's butt chicks. Pt. c/o of itching a lot. Plavix is the only new medication pt. is taken. Ollen Gross, RN, BSN  May 06, 2009 9:58 AM Dr. Lucien Mons DOD notified. MD advice because rash is only on his butts possible is something other than Plavix medication  reaction. Pt. to try to take something for it like Benadryl. Pt's wife to call back if symptoms continues. Pt's wife aware states she  is applying Benadryl cream on area.   Follow-up by: Ollen Gross, RN, BSN,  May 06, 2009 10:15 AM

## 2010-02-17 DIAGNOSIS — G8110 Spastic hemiplegia affecting unspecified side: Secondary | ICD-10-CM

## 2010-02-17 DIAGNOSIS — I6330 Cerebral infarction due to thrombosis of unspecified cerebral artery: Secondary | ICD-10-CM

## 2010-02-17 DIAGNOSIS — I69998 Other sequelae following unspecified cerebrovascular disease: Secondary | ICD-10-CM

## 2010-02-17 DIAGNOSIS — R209 Unspecified disturbances of skin sensation: Secondary | ICD-10-CM

## 2010-02-17 DIAGNOSIS — I69921 Dysphasia following unspecified cerebrovascular disease: Secondary | ICD-10-CM

## 2010-02-17 LAB — GLUCOSE, CAPILLARY
Glucose-Capillary: 76 mg/dL (ref 70–99)
Glucose-Capillary: 166 mg/dL — ABNORMAL HIGH (ref 70–99)

## 2010-02-17 LAB — PROTIME-INR
INR: 2.49 — ABNORMAL HIGH (ref 0.00–1.49)
Prothrombin Time: 27 seconds — ABNORMAL HIGH (ref 11.6–15.2)

## 2010-02-18 LAB — GLUCOSE, CAPILLARY
Glucose-Capillary: 97 mg/dL (ref 70–99)
Glucose-Capillary: 288 mg/dL — ABNORMAL HIGH (ref 70–99)
Glucose-Capillary: 249 mg/dL — ABNORMAL HIGH (ref 70–99)

## 2010-02-18 NOTE — Initial Assessments (Signed)
INTERNAL MEDICINE ADMISSION HISTORY AND PHYSICAL  PCP: Dr. Wyvonnia Lora, MD [place], [place] [phone]   1st contact Shanda Bumps, AI/MS4 [phone] 2nd contact Dr Gilford Rile [phone] Holidays or 5pm on weekdays:  1st contact [phone] 2nd contact [phone]  CC: Right sided weakness, difficulty speaking and swallowing  HPI: [name] [name] is an 77yo man with PMH type 2 DM, hypertension, CAD/NSTEMI s/p CABG, atrial fibrillation and chronic kidney disease who began experiencing difficulty talking and right arm numbness around 2pm one day prior to transfer per wife's report. Minutes later, he was found on the floor of his home with dense right hemiparesis, severe dysarthria, agitation. Wife reports he had not taken his medicine yesterday.  Additionally, she reports he had increased sleepiness and constipation for several days prior to the event and had received an extensive bowel regimen without effect (colon cleanse, stool softners, enemas). He was initally evaluated at [facility] and transferred today to [facility].  History difficult to obtain secondary to patient's aphasia; reports headache at present.  At the outside hospital he received ASA 325mg , bisacodyl, 1L NS, and 30mg  Lovenox. Per outside report, he was on coumadin previouslt for afib but this was discontinued years ago secondary to gastric upset and edema per cardiology, which resolved upon discontinuation.   ALLERGIES: Plavix - ?rash Glipizide - rash  PAST MEDICAL HISTORY: CAD - 4-vessel CABG 2001 ([place], [place]) LVEF 55% Hypertension Diabetes Type 2 Myocardial Infarction - NSTEMI 4/2011complicated by complete heart block s/p St. Jude pacemaker Atrial Fibrillation, persistent, diagnosed 2006 - refuses Coumadin Left internal carotid stenosis 70-80% (06/2009) Incontinence Hearing loss  MEDICATIONS: COREG 3.125 MG TABS (CARVEDILOL) Take 1 tablet by mouth twice a day NITROSTAT 0.4 MG SUBL (NITROGLYCERIN) 1 tablet under tongue at  onset CP; may repeat q5 minutes for up to 3 doses. ASPIRIN EC 325 MG TBEC (ASPIRIN) Take one tablet by mouth daily GLIPIZIDE 5 MG TABS (GLIPIZIDE) Take 1 tablet by mouth once a day LISINOPRIL 20 MG TABS (LISINOPRIL) Take 1 tablet by mouth once a day NIFEDIPINE 30 MG XR24H-TAB (NIFEDIPINE) Take 1 tablet by mouth once a day SIMVASTATIN 40 MG TABS (SIMVASTATIN) Take one tablet by mouth daily at bedtime LANTUS insulin 18U subQ at bedtime HUMALOG 3U Prior to meals LEXAPRO 20mg  by mouth once daily PROTONIX 40mg  at bedtime Coenzyme Q10, Vit A, E, Ca, Mg, Zn    SOCIAL HISTORY: Married  Tobacco Use - No Alcohol Use - no Drug Use - no Full Time - minister/ evangelist   FAMILY HISTORY: Family History of Coronary Artery Disease and hypertension   ROS: As per HPI, ROS unable to be performed secondary to patient status.  VITALS: T:97.60F  P:64  BP:186/93  MAP 124  R:20  O2SAT:99%  ON: room air  PHYSICAL EXAM: General:  Intermittently somonlent, able to open eyes, engage in eye contact and attempt to vocalize.  Appears to understand minor commands, impaired cooperativness with exam. Head:  normocephalic and atraumatic.   Eyes:  vision grossly intact, pupils equal, pupils round and constricted, pupils reactive to light, no injection and anicteric. Questionable diminished right eye movements to right side.    Mouth:  pharynx pink and moist, no erythema, and no exudates.   Neck:  supple, full ROM, no thyromegaly, no JVD, and no carotid bruits.   Lungs:  normal respiratory effort, no accessory muscle use, normal breath sounds, no crackles, and no wheezes.  Heart:  normal rate, irregularly irergular rhythm with premature beats auscultated, II/VI cresc-decresc  murmur at RUSB, no gallop, and no rub.   Abdomen:  soft, non-tender, normal bowel sounds, no distention, no guarding, no rebound tenderness, no hepatomegaly Msk:  no joint swelling, no joint warmth, and no redness over joints.   Pulses:  2+  DP/PT pulses bilaterally Extremities:  No cyanosis, clubbing, edema. Warm, well-perfused Neurologic:  intermittently alert alternating with somnolencel, oriented x0, unable to articulate name or location.  Cranial nerves show small pupils (1-31mm), EOMI except possible right eye diminished lateral movement, hemiparesis of right lower face with incomplete weakness of right upper face (able to close eye, move forehead less than contralateral side). Patient not able to follow commands for remainder of CN exam. 3/5 strength right upper and lower extremity, 4/5 strength left upper and lower extremity, sensation unable to assess, diminished reflexes right side.   Skin:  turgor normal and no rashes.   Psych:  Pleasant. Speech shows nonsensical repsonses to questions c/w Wernike's aphasia, repetition, difficult to understand distinct words but will answer yes-no to direct questions followed by incoherent words.   LABS:  From OSH: Na 133  K 4.0  Cl 97  HCO3 26  BUN 24  Cr 1.4  Ca 8.7  Gluc 251   WBC 9.5  Hgb 12.8  HCT 36.9  Plt 146 Alb 3.8  ALT 44  AST 38  ALP 60  CK 71  TBili 0.6  Pro 7.7 EKG (copy included) shows pacing with 5-beat run noncaptured beats CXR no acute disease   Internal labs pending: CBC, CMET, PT/INR, PTT, UA, A1c, FLP  EKG:   ASSESSMENT AND PLAN: Stroke: 77yo man presenting as transfer from OSH for right-sided weakness, unsteady balance since yesterday: At this point it appears that this patient has had an acute stroke CVA given the relatively sudden onset of dizziness, unsteady gait,-sided weakness, slurred speech, and right facial droop. This started >24 hours before the patient presented to this hospital, putting this patient outside the window for thrombolytic therapy. - Admit per stroke protocol to telemetry x24hours - Swallow screen pending - Non-contrast CT of the head pending - Vitals and neurochecks per protocol; zofran and tylenol as needed - Radiology called regarding  pacemaker and MRI: St. Jude Pacemaker not compatible with MRI - Regarding anticoagulation given patient's intolerance of Plavix and Coumadin, pharmacy was consulted. Pradaxa was considered; however, given difficulty of monitoring and reversal, and more post-CVA safety data on Coumadin, we elected to start Coumadin per protocol. Previous Coumadin use was discontinued by patient for gastric side effects; will monitor in-house for adverse effects.  ATRIAL FIBRILLATION: Will admit to telemetry and monitor closely. On EKG at transfer, patient appears predominantly paced.   - Repeat EKG - 2D ECHO - ASA, start Coumadin as above.  CAD: S/P NSTEMI, CABG__: Patient had pacer placed in 2011.  Appears paced on telemetry. EKG as above.  - Consult [place] cardiology for pacemaker interrogation given irregularities on exam and EKG (questionable capture rate)  ESSENTIAL HYPERTENSION: History of HTN -  Hold antihypertensives for permissive perfusion hypertension given acute CVA. -  BMET, CBC, FLP, EKG, and carotid and transcranial dopplers  HYPERLIPIDEMIA: Last FLP unavailable, patient on home statin therapy.  Will recheck Lipid panel; transaminases within normal limits.  - Hold statin acutely  DIABETES MELLITUS, TYPE II: HgbA1C pending. - Hold lisinopril. Cover with SSI.   VTE Proph: - Lovenox 40mg  Subcutaneously daily        Wife Claudia contact: [phone]    ATTENDING: I performed  and/or observed a history and physical examination of the patient.  I discussed the case with the residents as noted and reviewed the residents' notes.  I agree with the findings and plan--please refer to the attending physician note for more details.  Signature________________________________  Printed Name_____Wynne Woodyear_______

## 2010-02-18 NOTE — Progress Notes (Signed)
Summary: CVA  Phone Note Call from Patient Call back at Home Phone [phone]   Summary of Call: Pt's wife called stating her husband had just had a stroke. She then stated EMS had arrived and that she would need to go talk the them and ended the phone call. Pt's wife never stated if she had a question or just wanted to inform us of the stroke.  Pt has h/o a.fib and declined Coumadin in the past. Initial call taken by: Cyril Loosen, RN, BSN,  February 03, 2010 3:04 PM

## 2010-02-19 LAB — BASIC METABOLIC PANEL
CO2: 25 mEq/L (ref 19–32)
Chloride: 103 mEq/L (ref 96–112)
Creatinine, Ser: 1.53 mg/dL — ABNORMAL HIGH (ref 0.4–1.5)
GFR calc Af Amer: 53 mL/min — ABNORMAL LOW (ref >60)
Glucose, Bld: 42 mg/dL — CL (ref 70–99)

## 2010-02-19 LAB — GLUCOSE, CAPILLARY
Glucose-Capillary: 80 mg/dL (ref 70–99)
Glucose-Capillary: 413 mg/dL — ABNORMAL HIGH (ref 70–99)
Glucose-Capillary: 100 mg/dL — ABNORMAL HIGH (ref 70–99)
Glucose-Capillary: 254 mg/dL — ABNORMAL HIGH (ref 70–99)

## 2010-02-20 ENCOUNTER — Encounter: Payer: Self-pay | Admitting: Physical Medicine & Rehabilitation

## 2010-02-20 LAB — GLUCOSE, CAPILLARY
Glucose-Capillary: 296 mg/dL — ABNORMAL HIGH (ref 70–99)
Glucose-Capillary: 236 mg/dL — ABNORMAL HIGH (ref 70–99)

## 2010-02-21 LAB — GLUCOSE, CAPILLARY
Glucose-Capillary: 437 mg/dL — ABNORMAL HIGH (ref 70–99)
Glucose-Capillary: 378 mg/dL — ABNORMAL HIGH (ref 70–99)
Glucose-Capillary: 139 mg/dL — ABNORMAL HIGH (ref 70–99)

## 2010-02-22 LAB — GLUCOSE, CAPILLARY
Glucose-Capillary: 75 mg/dL (ref 70–99)
Glucose-Capillary: 141 mg/dL — ABNORMAL HIGH (ref 70–99)
Glucose-Capillary: 53 mg/dL — ABNORMAL LOW (ref 70–99)
Glucose-Capillary: 82 mg/dL (ref 70–99)

## 2010-02-22 LAB — PROTIME-INR: INR: 3.37 — ABNORMAL HIGH (ref 0.00–1.49)

## 2010-02-23 LAB — GLUCOSE, CAPILLARY: Glucose-Capillary: 219 mg/dL — ABNORMAL HIGH (ref 70–99)

## 2010-02-23 LAB — PROTIME-INR
INR: 2.99 — ABNORMAL HIGH (ref 0.00–1.49)
Prothrombin Time: 31.1 seconds — ABNORMAL HIGH (ref 11.6–15.2)

## 2010-02-24 LAB — BASIC METABOLIC PANEL
Chloride: 101 mEq/L (ref 96–112)
GFR calc non Af Amer: 34 mL/min — ABNORMAL LOW (ref >60)
Glucose, Bld: 98 mg/dL (ref 70–99)
Potassium: 4.2 mEq/L (ref 3.5–5.1)
Sodium: 136 mEq/L (ref 135–145)

## 2010-02-24 LAB — GLUCOSE, CAPILLARY
Glucose-Capillary: 94 mg/dL (ref 70–99)
Glucose-Capillary: 253 mg/dL — ABNORMAL HIGH (ref 70–99)
Glucose-Capillary: 107 mg/dL — ABNORMAL HIGH (ref 70–99)
Glucose-Capillary: 188 mg/dL — ABNORMAL HIGH (ref 70–99)

## 2010-02-24 LAB — PROTIME-INR: INR: 2.28 — ABNORMAL HIGH (ref 0.00–1.49)

## 2010-02-25 LAB — BASIC METABOLIC PANEL
CO2: 26 mEq/L (ref 19–32)
Chloride: 105 mEq/L (ref 96–112)
GFR calc Af Amer: 40 mL/min — ABNORMAL LOW (ref >60)
Sodium: 137 mEq/L (ref 135–145)

## 2010-02-25 LAB — GLUCOSE, CAPILLARY: Glucose-Capillary: 124 mg/dL — ABNORMAL HIGH (ref 70–99)

## 2010-02-25 NOTE — Consult Note (Signed)
NAMESHRAY, HUNLEY NO.:  [id number]  MEDICAL RECORD NO.:  [id number]           PATIENT TYPE:  I  LOCATION:  4035                         FACILITY:  [facility]  PHYSICIAN:  [name] [name], M.D.  DATE OF BIRTH:  1924-07-31  DATE OF CONSULTATION:  02/23/2010 DATE OF DISCHARGE:                                CONSULTATION   REQUESTED BY:  [name] Colace, MD.  REASON FOR CONSULTATION:  Diabetes mellitus, uncontrolled.  HISTORY OF PRESENT ILLNESS:  [name] is an 85-year of age gentleman.  He has had type 2 diabetes mellitus, diagnosed around 2007. Initially, he maintained glycemic control with Atkins diet.  Eventually, requiring glipizide.  In November 2011, after urinary tract infection, his hyperglycemia worsened.  Since that time, he has required insulin therapy.  At home, he was using Lantus 18 units subcutaneous at bedtime each day though sometimes his family would give him only 12 or 15 units to avoid hypoglycemia.  He would also take Humalog 3 units at each meal. Often his blood glucose would be around 118 to 124 mg/dL at home.  He crave sweets which has been contributing factor to his glycemic control. No associated symptoms at this time.  To their knowledge, no microvascular complication such as nephropathy, retinopathy, or neuropathy.  He has coronary artery disease with CABG in 2001.  He has recently experienced a stroke with associated atrial fibrillation. Currently, on physical rehabilitation unit.  No history of severe hypoglycemia, requiring third-party assistance.  No history of diabetic ketoacidosis or hyperosmolar nonketotic syndrome.  PAST MEDICAL HISTORY:  As per history of present illness.  FAMILY MEDICAL HISTORY:  Includes, one brother with diabetes mellitus, who required amputation of toes.  Two sisters with diabetes mellitus.  SOCIAL HISTORY:  No tobacco use.  Married.  Accompanied by his wife today.  They have children and  grandchildren in [place] and [place].  ALLERGIES:  PLAVIX CAUSED RASH, SIMVASTATIN OR ZOCOR CAUSED RASH.  REVIEW OF SYSTEMS:  The patient reports recent weight loss.  No weight gain.  No anorexia, nausea, or vomiting, but he does have occasional mild diarrhea or constipation.  No recent chest pain, but has experienced some mild peripheral edema recently.  No diplopia or blurry vision, but his vision has been affected by this stroke, specifically his right eye.  He denies numbness, tingling, or burning in the feet. No rash on the feet or foot ulcers.  PHYSICAL EXAMINATION:  VITAL SIGNS:  Temperature 97.8, pulse 61, respirations 14, blood pressure 162/83, oxygen saturation 94% on room air.  Height 64 inches, weight 70.8 kg. GENERAL:  Lying quietly and comfortably in bed. NEUROLOGIC:  Consistent with his recent stroke.  Difficult to tell if it is from difficulty cooperating with exam, but he may have absent monofilament sensation in both feet. HEAD, EARS, NOSE AND THROAT:  No significant abnormalities. CARDIOVASCULAR:  Irregularly irregular.  Pedal pulses symmetric and full. RESPIRATORY:  Clear, symmetric, unlabored. GASTROINTESTINAL:  Active bowel sounds, soft, nontender, nondistended. SKIN:  Appropriate warmth.  No visible rash on the feet.  No tinea pedis. MENTAL STATUS EXAM:  Appropriate affect for the circumstances.  Alert and conversant.  LABORATORY DATA:  Blood glucose levels reviewed including levels ranging from 53 to 437 in the past 3 days.  Pattern of diminishing blood glucose overnight.  Occasional hypoglycemia after taking mealtime insulin dose. Also, tendency towards hyperglycemia if mealtime insulin is skipped.  In the past evenings, he received 20 units of Lantus each evening.  He has received 5 units of NovoLog for the entire day on February 6 and 8 units at lunch time today.  Laboratory data also includes, sodium 137, potassium 4.1, bicarbonate 25, glucose 30,  creatinine 1.5 which is relatively stable and glucose of 42 at 6:00 a.m. on February 19, 2010. On February 05, 2010, hemoglobin A1c 9.3% with an estimated average glucose of 220 mg/dL.  ASSESSMENT/PLAN: 1. Type 2 diabetes mellitus, uncontrolled. 2. Renal insufficiency.  At this time, recommend reducing the Lantus insulin.  By reducing the Lantus insulin, we will lower the risk of hypoglycemia overnight and during the day.  Once he has overcome the hypoglycemia, then he will be able to take his mealtime insulin on a more regular schedule and avoid some of the postprandial hyperglycemia and hyperglycemia that he has experienced after treatment of low blood glucose.  First, we will begin with Lantus 14 units subcutaneous at bedtime each day.  Continue NovoLog 5 units subcutaneous at meals plus correction scale for NovoLog at mealtime.  This will provide a comparable amount of insulin per day as he used at home. I reviewed the care plan with the patient and his wife.  They are in agreement with the plan and expressed their appreciation.  If reducing the Lantus is not sufficient to reduce the risk of overnight hypoglycemia, consider giving the Lantus in the morning or switching to Levemir once a day each morning.  With his renal insufficiency, he may have a predisposition to overnight hypoglycemia.     [name] [name], M.D.     JSK/MEDQ  D:  02/23/2010  T:  02/24/2010  Job:  [id number]  Electronically Signed by Talmage Coin M.D. on 02/25/2010 12:50:31 PM

## 2010-02-26 NOTE — Discharge Summary (Signed)
[name] [name], [name] [name] NO.:  [id number]  MEDICAL RECORD NO.:  [id number]           PATIENT TYPE:  I  LOCATION:  4035                         FACILITY:  [facility]  PHYSICIAN:  Erick Colace, M.D.DATE OF BIRTH:  [birth year]  DATE OF ADMISSION:  02/10/2010 DATE OF DISCHARGE:                              DISCHARGE SUMMARY   DISCHARGE DIAGNOSES: 1. Left frontoparietal infarction. 2. Atrial fibrillation with chronic atrial fibrillation. 3. Dysphagia. 4. Diabetes mellitus. 5. Coronary artery disease with permanent pacemaker. 6. Hypertension. 7. Hyperlipidemia.  An 77 year old white male history of coronary artery disease with bypass grafting, atrial fibrillation.  He had refused Coumadin in the past, admitted on February 04, 2010 with right-sided weakness and aphasia. Cranial CT scan showed left frontoparietal lobe infarction.  MRI of the brain was not completed secondary to permanent pacemaker. Echocardiogram with ejection fraction of 60% without PFO.  Carotid Dopplers with left greater than 80% internal carotid artery stenosis. EKG with atrial fibrillation, cardiac panel negative.  Coumadin therapy was initiated.  Modified barium swallow on February 05, 2010, placed on dysphagia II nectar-thick liquid diet.  Non-insulin dependent diabetes mellitus with hemoglobin A1c of 9.8.  Lantus insulin was initiated, was total-assist for ambulation.  He was admitted for comprehensive rehab program.  PAST MEDICAL HISTORY:  See discharge diagnoses.  ALLERGIES:  PLAVIX.  SOCIAL HISTORY:  Lives with his wife, two-level home, bedroom downstairs, one step to entry.  Wife can assist on discharge.  Functional history prior to admission was independent.  Functional status upon admission to Rehab Services was total-assist bed mobility, total-assist transfers, total-assist ambulation 10 feet x2 with a rolling walker, max-assist for activities of daily living.  MEDICATIONS PRIOR  TO ADMISSION: 1. Lisinopril 20 mg daily. 2. Fish oral daily. 3. Coreg 3.125 mg twice daily. 4. Nifedipine 30 mg daily. 5. Zocor 40 mg at bedtime. 6. Nitroglycerin as needed. 7. Aspirin 325 mg daily. 8. Glipizide 5 mg daily. 9. Multivitamin daily.  PHYSICAL EXAMINATION:  VITAL SIGNS:  Blood pressure 154/68, pulse 77, temperature 97, respirations 22. GENERAL: This was an alert male in no acute distress.  He was aphasic. Follow basic commands.  Deep tendon reflexes 2+.  Sensation decreased to light touch on the right. LUNGS:  Clear to auscultation. CARDIAC:  Rate controlled. ABDOMEN:  Soft, nontender.  Good bowel sounds.  REHABILITATION HOSPITAL COURSE:  The patient was admitted to Inpatient Rehab Services with therapies initiated on a 3-hour daily basis consisting of Physical Therapy, Occupational Therapy, Speech Therapy and rehabilitation nursing.  The following issues were addressed during the patient's rehabilitation stay.  Pertaining to Mr. Villella' left frontoparietal infarction remained stable, he had been placed on Coumadin therapy for both stroke prophylaxis and atrial fibrillation. Latest INR of 2.9.  He was to be followed by Dr. Wyvonnia Lora at time of discharge.  Noted dysphagia secondary to infarction.  Diet have been steadily advanced to a dysphagia III thin liquid diet, which he was tolerating nicely.  His blood pressures were monitored on Norvasc as well as Coreg with no orthostatic changes as well as lisinopril.  One of  his biggest obstacles during his rehabilitation hospital stay was related to his diabetes mellitus with hemoglobin A1c of 9.8.  Multiple attempts for better control were made with Lantus insulin. Endocrinology service, Dr. Sharl Ma was consulted and recommendations ongoing with family teaching.  His Lantus insulin had been steadily reduced due to variables in his blood sugars and monitored.  Overall strength and endurance continued to improve as he was  encouraged to overall progress and ultimate plan has been discharged on February 25, 2010.  The patient received weekly collaborative interdisciplinary team conferences to discuss estimated length of stay, family teaching and any barriers to discharge.  He was minimal-assist level for bathing, dressing and toileting.  He was minimal-assist to ambulate with a rolling walker with cues for safety.  Noted receptive as well as expressive aphasia.  He could independently use liquids to wash for sponge, home health therapies would be ongoing.  DISCHARGE MEDICATIONS AT TIME OF DICTATION: 1. Coumadin 3 mg daily. 2. Norvasc 10 mg daily. 3. Aspirin 81 mg daily. 4. Coreg 6.25 mg twice daily. 5. Crestor 10 mg at bedtime. 6. Ditropan 5 mg twice daily. 7. Lantus insulin was currently at 14 units subcutaneously at bedtime;     however, this was adjusted accordingly as per Endocrinology     Services.  His lisinopril was currently on hold just to monitor     blood pressures.  DISCHARGE DIET:  Diabetic dysphagia III thin liquid diets.  Medications hold and puree.  Home health nurse had been ordered for Coumadin therapy, to be followed by Dr. Wyvonnia Lora of [place], [place].  He would  follow up Dr. Claudette Laws, [facility] as advised and Dr. Sharl Ma, Endocrinology Services as directed.     Mariam Dollar, P.A.   ______________________________ Erick Colace, M.D.    DA/MEDQ  D:  02/24/2010  T:  02/25/2010  Job:  [id number]  cc:   Nita Sells, MD  Electronically Signed by Mariam Dollar P.A. on 02/26/2010 07:43:44 AM Electronically Signed by Claudette Laws M.D. on 02/26/2010 04:26:18 PM

## 2010-03-02 NOTE — H&P (Signed)
[name] [name], [name] [name] NO.:  [id number]  MEDICAL RECORD NO.:  [id number]          PATIENT TYPE:  IPS  LOCATION:  4035                         FACILITY:  [facility]  PHYSICIAN:  Ranelle Oyster, M.D.DATE OF BIRTH:  1924-06-24  DATE OF ADMISSION:  02/10/2010 DATE OF DISCHARGE:                             HISTORY & PHYSICAL   CHIEF COMPLAINT:  Right-sided weakness and language deficits.  PRIMARY CARE PHYSICIAN:  Dr. Wyvonnia Lora, in [place].  CARDIOLOGIST:  Jonelle Sidle, MD  HISTORY OF PRESENT ILLNESS:  This is a pleasant 77 year old white male with CAD and history of CABG as well as atrial fibrillation.  He refused Coumadin in the past.  He was admitted on February 04, 2010, with right- sided weakness and aphasia.  CT was positive for left frontoparietal infarct.  MRI was not done secondary to pacer.  Echocardiogram showed ejection fraction of 60% without a PFO.  Carotid Dopplers showed left greater than 80% ICA stenosis.  EKG is positive for atrial fibrillation. Cardiac panel was negative.  Coumadin therapy was started.  Modified barium swallow was done on February 05, 2010.  He was placed on a D2 nectar liquid diet.  Lantus insulin was started for elevated sugars and hemoglobin A1c was 9.8.  I saw the patient yesterday for rehab consultation and felt he could benefit from an inpatient rehab stay.  REVIEW OF SYSTEMS:  Notable for incontinence, palpitations.  Full 12- point review is in the written health and history section of the chart and somewhat limited due to his language status.  PAST MEDICAL HISTORY:  Positive for diabetes, CAD with CABG and permanent pacer, atrial fibrillation, decreased hearing.  He has a history of appendectomy and cholecystectomy as well.  FAMILY HISTORY:  Positive for CAD.  SOCIAL HISTORY:  The patient lives with his wife in 2-level house, bedroom downstairs, and 1 step to enter.  Wife can assist at discharge.  ALLERGIES:   PLAVIX, and questionably GLIPIZIDE.  HOME MEDICATIONS:  Lisinopril, fish oil, Coreg, nifedipine, Zocor, nitroglycerin, aspirin, glipizide, and multivitamin.  LABS:  Hemoglobin 12.6, white count 9, platelets 166.  Sodium 135, potassium 4.5, BUN 21, creatinine 1.45, INR 1.2.  PHYSICAL EXAMINATION:  VITAL SIGNS:  Blood pressure 154/68, pulse 77, respiratory rate is 22, temperature 97. GENERAL:  The patient is pleasant, sitting in his bed, in no acute distress. HEENT:  Pupils equal, round, and reactive light.  Ears, nose, and throat exam is notable for partial dentures and some dental caries. NECK:  Supple without JVD or lymphadenopathy. CHEST:  Clear to auscultation bilaterally without wheezes, rales, or rhonchi. HEART:  Regular rate and rhythm without murmurs, rubs, or gallops. EXTREMITIES:  Showed no clubbing, cyanosis, or edema. ABDOMEN:  Soft, nontender.  Bowel sounds positive. SKIN:  Generally intact throughout. NEUROLOGIC:  Cranial nerves II-XII were within normal limits except for right central VII and tongue deviation.  Reflexes are 1+ really throughout.  Sensation was a bit diminished to pinprick and light touch, although he did have pain sensation in the right arm and leg.  Toes were unreactive at the foot.  Strength  in right upper extremity is trace deltoid, 1-1+ biceps and triceps, trace to zero at the wrist and fingers on the right.  Right lower extremity, 3/5 hip flexor, 3-3+ knee extensor and knee flexion, and then 3+ right ankle dorsiflexion, plantar flexion. The patient with significant expressive aphasia as well as some receptive aphasia.  He may have an element of apraxia additionally.  We could not fully assess judgment, orientation, or memory due to his language deficits.  POST ADMISSION PHYSICIAN EVALUATION.: 1. Functional deficit secondary to left frontoparietal infarct with     right hemiparesis and global aphasia/apraxia. 2. The patient was admitted to  receive collaborative interdisciplinary     care between the physiatrist, rehab nursing staff, and therapy     team. 3. The patient's level of medical complexity and substantial therapy     needs in context of that medical necessity cannot be provided at a     lesser intensity of care. 4. The patient has experienced substantial functional loss from his     baseline.  Premorbidly, the patient was independent.  Currently,     total assist bed mobility, total assist transfers 65%, total assist     gait 10 feet x2 (60%), max assist ADLs.  Judging by the patient's     diagnosis, physical exam, and functional history, he has potential     for functional progress which will result in measurable gains while     inpatient rehab.  Gains will be of substantial practical use upon     discharge home in facilitating mobility and self-care. 5. A physiatrist will provide 24-hour management of medical needs as     well as oversight of therapy plan/treatment and provide guidance as     appropriate regarding interaction of the two.  Medical problem list     and plan are below. 6. A 24-hour rehab nursing team will assist in managing the patient's     bowel and bladder issues, safety awareness, integration of therapy     concepts and techniques. 7. PT will assess and treat for lower extremity strength, range of     motion, neuromuscular education, balance, adaptive equipment     training, and the patient and family education with goals     supervision to occasional min assist. 8. OT will assess and treat for upper extremity use ADLs,     neuromuscular education, strength, adaptive techniques, equipment,     safety and functional mobility with goals modified independent to     mod assist especially with lower body tasks. 9. Speech Language Pathology will assess and treat for cognitive     language and swallowing deficits with goals modified independent     with diet to min-to-mod assist with  language. 10.Case management and social worker will assess and treat for     psychosocial issues and discharge planning. 11.Team conference will be held weekly to assess progress towards     goals and to determine barriers at discharge. 12.The patient demonstrated sufficient medical stability and exercise     capacity to tolerate at least 3 hours therapy per day at least 5     days a week. 13.Estimated length of stay is 3 weeks.  Prognosis is good.  MEDICAL PROBLEM LIST AND PLAN: 1. Deep vein thrombosis prophylaxis/anticoagulation with Lovenox to     Coumadin bridge and aspirin.  Follow platelets and regular CBCs.     No active signs of bleeding on exam today. 2. Mood.  We  will provide ego support as appropriate and administer     depression screen while on rehab. 3. Dysphagia.  D2 nectar liquid diet now.  Check electrolytes     regularly and follow up with Speech Language Pathology regarding     progression. 4. Diabetes type 2.  Lantus 25 units at bedtime.  We will need further     adjustment with sugars most recently ranging in the 200s to 300s at     times. 5. Coronary artery disease.  Continue Norvasc, Coreg, and lisinopril     per primary team.  Follow blood pressure, heart rate parameters,     and observe clinical tolerance of activity. 6. Hyperlipidemia.  Crestor.     Ranelle Oyster, M.D.     ZTS/MEDQ  D:  02/10/2010  T:  02/10/2010  Job:  [id number]  cc:   Nita Sells, MD  Electronically Signed by Faith Rogue M.D. on 03/02/2010 04:38:07 PM

## 2010-03-14 NOTE — Discharge Summary (Signed)
[name], [name] NO.:  [id number]  MEDICAL RECORD NO.:  [id number]          PATIENT TYPE:  INP  LOCATION:  3019                         FACILITY:  [facility]  PHYSICIAN:  Tilford Pillar, MD     DATE OF BIRTH:  21-Aug-1924  DATE OF ADMISSION:  02/04/2010 DATE OF DISCHARGE:  02/10/2010                              DISCHARGE SUMMARY   DISCHARGE DIAGNOSES: 1. Acute ischemic cerebrovascular accident. 2. Atrial fibrillation. 3. Coronary artery disease. 4. Essential hypertension. 5. Hyperlipidemia. 6. Type 2 diabetes mellitus.  DISCHARGE MEDICATIONS: 1. Amlodipine 10 mg one tablet daily. 2. Aspirin 81 mg one tablet daily. 3. Carvedilol 6.25 mg tablet twice a day. 4. Lovenox 40 mg subcu everyday. 5. Lantus insulin 25 units subcu every night at bedtime. 6. Insulin aspart 1-9 units subcu 3 times a day with meals per sliding     scale with sensitive coverage. 7. Lisinopril 40 mg by mouth daily. 8. Rosuvastatin 10 mg p.o. daily. 9. Warfarin per pharmacy protocol. 10.Tylenol 650 mg tablet by mouth every 4 hours as needed for pain.  DISPOSITION AND FOLLOWUP: 1. Inpatient rehabilitation at [facility] upon discharge. 2. Followup with primary care provider, [name] [name], phone number     [phone].  This should be arranged at discharge from inpatient     rehab. 3. [facility] to be arranged upon discharge from inpatient     rehab.  PROCEDURES PERFORMED: 1. Head CT without contrast on February 05, 2010.  Impression:  No     intracranial hemorrhage. 2. Swallowing function study via fluoroscopy from February 05, 2010.     See dictation for full report. 3. Carotid and transcranial Dopplers showed greater than 80% left     internal carotid artery stenosis with no appreciable stenosis on     the right ICA with antegrade flow in bilateral vertebral arteries.  CONSULTATIONS:  Social work was consulted regarding arrangement of inpatient  rehabilitation and discussions with family regarding discharge disposition.  HISTORY AND PHYSICAL EXAMINATION:  [name] [name] was admitted after transferred from an outside hospital on February 04, 2010.  One day prior, he had presented to the outside hospital with abrupt onset of right-sided severe weakness and difficulty with speaking and swallowing. At the outside hospital, the patient did not receive TPA.  He presented with the aforementioned right-sided weakness including dense hemiparesis of the right arm and mild weakness of the right leg, right-sided facial droop and severe Wernicke-type aphasia.  The patient had a questionable complaint with this medication regimen including full strength aspirin for anticoagulation prior to his arrival.  ADMISSION LABS:  CBC showed white blood count 9.1, hemoglobin 12.6, and platelets 166.  PT 14.3 and INR 1.09.  Cardiac panel showed creatine kinase of 111, CK-MB 2.9, and troponin-I 0.01.  Sodium 133, potassium 3.9, chloride 101, bicarb 23, glucose 173, BUN 17, creatinine 1.36, bilirubin 0.6, alk phos 57, AST 33, ALT 30, total protein 6.9, albumin 3.3, calcium 8.7, cholesterol 122, triglycerides 73, HDL 44, LDL 63, and hemoglobin A1c 9.3.  HOSPITAL COURSE: 1. Ischemic CVA of the left frontal parietal region.  The patient was     admitted per stroke protocol to telemetry.  He was started on     anticoagulation. Per warfarin protocol, given the history of atrial     fibrillation.  Despite having history of GI intolerance to     warfarin, he tolerated this well during his hospitalization.     Speech therapy was consulted regarding ability to swallow foods and     recommended a nectar-thick and dysphagia II diet.  Noncontrast CT     of the head was performed and showed no evidence of hemorrhage.     Vital signs and neuro checks were performed for protocol.     Throughout his hospitalization, the patient's initial symptoms     steadily improved.  His  aphasia improved to the point where he was     able to string together coherent phrases and appropriately answered     questions.  He continued to have some difficulty with occasional     word finding and repetition.  His lower extremity weakness improved     to 4-/5 strength in the right lower extremity and 3-4/5 strength in     the right upper extremity with more dense hemiparesis of the right     hand.  The patient continues to have a facial droop on the right     lower face. 2. Atrial fibrillation.  The patient was admitted to telemetry and     monitored closely.  On EKG, the patient appears predominantly paced     in terms of rhythm.  Cardiology was consulted and performed     interrogation of the pacemaker; the pacer was adjusted to increase     the capture of his pacing.  2-D echo showed normal left ventricular     ejection fraction of 55% to 60%, moderate left ventricular     hypertrophy and mildly dilated right and left atrium.  The patient     was continued on aspirin, but the dose was decreased to 81 mg in     light of the Coumadin anticoagulation as noted above. 3. Coronary artery disease.  The patient was treated with aspirin as     above.  The pacemaker was interrogated as above.  No new changes. 4. Essential hypertension.  The patient has a history of hypertension.     Initially, antihypertensives were held to allow permissive     hypertension for perfusion in the setting of ischemic CVA.  After 2     days, the antihypertensives were gradually restarted.  In order to     achieve adequate blood pressure control, his Coreg was increased to     6.25 mg twice a day.  The nifedipine was changed to amlodipine and     increased to 10 mg a day.  He was continued on lisinopril and     increased to a max dose of 40 mg a day.  Carotid Doppler showed     greater than 80% ICA stenosis on the left side.  This should be     followed up as an outpatient. 5. Hyperlipidemia.  Fasting lipid  panel showed an LDL of 63.  The     patient was continued on his home dose of Crestor. 6. Diabetes mellitus type 2.  Hemoglobin A1c this admission was 9.3.     Glipizide was held and may be restarted at 5 mg as an outpatient.     This will be followed with primary care  doctor.  The patient was     covered with sliding scale insulin and then gradually restarted on     Lantus.  Discharge dose of Lantus was 25 units at night.     Primary care physician, Wyvonnia [name], please followup on the     patient's control of type 2 diabetes, including addition of meal     time Humalog and glipizide 5 mg daily back into the regimen.  For     Cardiology, please followup on results of carotid Dopplers study     showing greater than 80% stenosis of the left ICA.     Darnelle Maffucci, MD   ______________________________ Tilford Pillar, MD    PT/MEDQ  D:  02/10/2010  T:  02/11/2010  Job:  [id number]  cc:   Wyvonnia [name]  Electronically Signed by Darnelle Maffucci  on 03/05/2010 02:49:26 PM Electronically Signed by Tilford Pillar  on 03/14/2010 09:00:17 PM

## 2010-04-05 ENCOUNTER — Inpatient Hospital Stay: Payer: Self-pay | Admitting: Physical Medicine & Rehabilitation

## 2010-04-06 LAB — BASIC METABOLIC PANEL
CO2: 27 mEq/L (ref 19–32)
Glucose, Bld: 276 mg/dL — ABNORMAL HIGH (ref 70–99)
Potassium: 4 mEq/L (ref 3.5–5.1)
Sodium: 134 mEq/L — ABNORMAL LOW (ref 135–145)

## 2010-04-06 LAB — CBC
HCT: 33.3 % — ABNORMAL LOW (ref 39.0–52.0)
HCT: 35.8 % — ABNORMAL LOW (ref 39.0–52.0)
Hemoglobin: 11.2 g/dL — ABNORMAL LOW (ref 13.0–17.0)
Hemoglobin: 12.6 g/dL — ABNORMAL LOW (ref 13.0–17.0)
MCHC: 35.2 g/dL (ref 30.0–36.0)
MCV: 94.3 fL (ref 78.0–100.0)
Platelets: 128 10*3/uL — ABNORMAL LOW (ref 150–400)
RBC: 3.53 MIL/uL — ABNORMAL LOW (ref 4.22–5.81)
RDW: 13.6 % (ref 11.5–15.5)
RDW: 13.9 % (ref 11.5–15.5)

## 2010-04-06 LAB — GLUCOSE, CAPILLARY
Glucose-Capillary: 139 mg/dL — ABNORMAL HIGH (ref 70–99)
Glucose-Capillary: 223 mg/dL — ABNORMAL HIGH (ref 70–99)
Glucose-Capillary: 235 mg/dL — ABNORMAL HIGH (ref 70–99)
Glucose-Capillary: 322 mg/dL — ABNORMAL HIGH (ref 70–99)

## 2010-04-06 LAB — DIFFERENTIAL
Basophils Absolute: 0 10*3/uL (ref 0.0–0.1)
Basophils Relative: 0 % (ref 0–1)
Eosinophils Relative: 1 % (ref 0–5)
Monocytes Absolute: 1.1 10*3/uL — ABNORMAL HIGH (ref 0.1–1.0)

## 2010-04-06 LAB — T4: T4, Total: 4.4 ug/dL — ABNORMAL LOW (ref 5.0–12.5)

## 2010-04-07 LAB — COMPREHENSIVE METABOLIC PANEL
ALT: 56 U/L — ABNORMAL HIGH (ref 0–53)
AST: 38 U/L — ABNORMAL HIGH (ref 0–37)
CO2: 25 mEq/L (ref 19–32)
Chloride: 102 mEq/L (ref 96–112)
GFR calc Af Amer: >60 mL/min (ref >60)
GFR calc non Af Amer: >60 mL/min (ref >60)
Sodium: 133 mEq/L — ABNORMAL LOW (ref 135–145)
Total Bilirubin: 0.7 mg/dL (ref 0.3–1.2)

## 2010-04-07 LAB — CARDIAC PANEL(CRET KIN+CKTOT+MB+TROPI)
CK, MB: 2.8 ng/mL (ref 0.3–4.0)
Relative Index: INVALID (ref 0.0–2.5)
Total CK: 58 U/L (ref 7–232)
Total CK: 64 U/L (ref 7–232)
Troponin I: 0.65 ng/mL (ref 0.00–0.06)

## 2010-04-07 LAB — DIFFERENTIAL
Basophils Absolute: 0 10*3/uL (ref 0.0–0.1)
Basophils Relative: 0 % (ref 0–1)
Eosinophils Absolute: 0.2 10*3/uL (ref 0.0–0.7)
Eosinophils Relative: 2 % (ref 0–5)

## 2010-04-07 LAB — TSH: TSH: 9.727 u[IU]/mL — ABNORMAL HIGH (ref 0.350–4.500)

## 2010-04-07 LAB — MRSA PCR SCREENING: MRSA by PCR: NEGATIVE

## 2010-04-07 LAB — CBC
MCV: 94 fL (ref 78.0–100.0)
RBC: 3.89 MIL/uL — ABNORMAL LOW (ref 4.22–5.81)
WBC: 10.4 10*3/uL (ref 4.0–10.5)

## 2010-04-13 ENCOUNTER — Ambulatory Visit (INDEPENDENT_AMBULATORY_CARE_PROVIDER_SITE_OTHER): Payer: Medicare (Managed Care) | Admitting: Vascular Surgery

## 2010-04-13 ENCOUNTER — Encounter: Payer: Medicare (Managed Care) | Attending: Physical Medicine & Rehabilitation

## 2010-04-13 ENCOUNTER — Other Ambulatory Visit (INDEPENDENT_AMBULATORY_CARE_PROVIDER_SITE_OTHER): Payer: Medicare (Managed Care)

## 2010-04-13 ENCOUNTER — Inpatient Hospital Stay (HOSPITAL_BASED_OUTPATIENT_CLINIC_OR_DEPARTMENT_OTHER): Payer: Medicare (Managed Care) | Admitting: Physical Medicine & Rehabilitation

## 2010-04-13 DIAGNOSIS — M7020 Olecranon bursitis, unspecified elbow: Secondary | ICD-10-CM | POA: Insufficient documentation

## 2010-04-13 DIAGNOSIS — I6330 Cerebral infarction due to thrombosis of unspecified cerebral artery: Secondary | ICD-10-CM

## 2010-04-13 DIAGNOSIS — I6529 Occlusion and stenosis of unspecified carotid artery: Secondary | ICD-10-CM

## 2010-04-13 DIAGNOSIS — I69920 Aphasia following unspecified cerebrovascular disease: Secondary | ICD-10-CM | POA: Insufficient documentation

## 2010-04-13 DIAGNOSIS — G8110 Spastic hemiplegia affecting unspecified side: Secondary | ICD-10-CM

## 2010-04-13 DIAGNOSIS — I69959 Hemiplegia and hemiparesis following unspecified cerebrovascular disease affecting unspecified side: Secondary | ICD-10-CM | POA: Insufficient documentation

## 2010-04-13 NOTE — Assessment & Plan Note (Signed)
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  He had previously suffered a non-Q MI 2 months previously and developed bradycardia and required a pacemaker insertion.  He had coronary artery bypass graft done in 2001 in [place].  He was asymptomatic from a neurologic standpoint, and he was to return in 6 months for a follow-up. He returns today for a follow-up carotid duplex exam, and he unfortunately had a left brain stroke in the middle of January of this year 6 months following previous evaluation.  This consisted of aphasia and right hemiparesis, all of which has improved significantly over the last 2 months.  He has had no more cardiac issues but has not seen Dr. Diona Browner recently.  CHRONIC MEDICAL PROBLEMS: 1. Type 1 diabetes mellitus. 2. Hypertension. 3. Coronary artery disease for arrhythmia with pacemaker. 4. Negative for COPD. 5. Positive for left brain stroke.  SOCIAL HISTORY:  He is a Education officer, environmental.  He is married.  He has 7 children.  He does not use tobacco or alcohol.  REVIEW OF SYSTEMS:  He denies any chest pain, dyspnea on exertion.  Does have weight loss, occasional dizziness, wheezing, joint pain, arthritis, chronic atrial fibrillation, and a recent urinary tract infection.  PHYSICAL EXAMINATION:  Blood pressure 143/80, heart rate 57, respirations are 14.  Generally, he is an elderly male who is in no apparent distress, alert and oriented x3.  His neurologic exam reveals slow speech with some expressive aphasia.  He has some mild to moderate weakness in the right upper and lower extremities.  He is able to elevate his arms against gravity and has a moderate grip.  Able to ambulate with a cane.  Lungs:  Clear to auscultation.  No rhonchi  or wheezing.  Cardiovascular:  Regular rhythm.  Carotid pulses are 3+.  A soft bruit on the left.  Abdomen:  Soft, nontender with no masses.  Today I ordered a carotid duplex exam which I have reviewed and interpreted.  His findings on the left carotid are similar to his study 9 months ago with an approximate 80% left internal carotid stenosis.  No flow reduction on the right side.  This patient has had good recovery from his left brain stroke which occurred 2 months ago, and I think should have a left carotid endarterectomy to prevent further damage to the left hemisphere.  We will arrange for him to see Dr. Simona Huh for a cardiac evaluation and clearance, and he will return in 2 months unless he has any new symptoms, and at that point we will schedule him for a left carotid endarterectomy.    [name] [name], M.D. Electronically Signed  JDL/MEDQ  D:  04/13/2010  T:  04/13/2010  Job:  [id number]

## 2010-04-16 ENCOUNTER — Inpatient Hospital Stay: Payer: Self-pay | Admitting: Physical Medicine & Rehabilitation

## 2010-04-17 NOTE — Procedures (Unsigned)
CAROTID DUPLEX EXAM  INDICATION:  Follow up carotid stenosis.  HISTORY: Diabetes:  Yes. Cardiac:  Coronary artery disease. Hypertension:  Yes. Smoking:  No. Previous Surgery:  No. CV History:  CVA in January 2012. Amaurosis Fugax No, Paresthesias Yes, Hemiparesis Yes.                                      RIGHT             LEFT Brachial systolic pressure:         142               144 Brachial Doppler waveforms:         WNL               WNL Vertebral direction of flow:        Antegrade         Antegrade DUPLEX VELOCITIES (cm/sec) CCA peak systolic                   69                66 ECA peak systolic                   135               60 ICA peak systolic                   80                398 ICA end diastolic                   22                80 PLAQUE MORPHOLOGY:                  Heterogenous      Calcified PLAQUE AMOUNT:                      Mild              Moderate-to-severe PLAQUE LOCATION:                    CCA, ICA, ECA     CCA, ICA  IMPRESSION: 1. Right internal carotid artery stenosis in the 1% to 39% range. 2. Left internal carotid artery stenosis in the 60% to 79% range,     which may be under estimated due to calcified plaque, making     Doppler interrogation difficult. 3. Left carotid evaluation is essentially unchanged since the previous     study on 06/29/2009.  ___________________________________________ Quita Skye. Hart Rochester, M.D.  SH/MEDQ  D:  04/13/2010  T:  04/13/2010  Job:  [id number]

## 2010-05-10 ENCOUNTER — Encounter: Payer: Self-pay | Admitting: Cardiology

## 2010-05-11 ENCOUNTER — Ambulatory Visit (INDEPENDENT_AMBULATORY_CARE_PROVIDER_SITE_OTHER): Payer: Medicare (Managed Care) | Admitting: Cardiology

## 2010-05-11 ENCOUNTER — Encounter: Payer: Self-pay | Admitting: Cardiology

## 2010-05-11 VITALS — BP 146/75 | HR 60 | Ht 64.0 in | Wt 155.1 lb

## 2010-05-11 DIAGNOSIS — I2510 Atherosclerotic heart disease of native coronary artery without angina pectoris: Secondary | ICD-10-CM

## 2010-05-11 DIAGNOSIS — I4891 Unspecified atrial fibrillation: Secondary | ICD-10-CM

## 2010-05-11 DIAGNOSIS — I10 Essential (primary) hypertension: Secondary | ICD-10-CM

## 2010-05-11 DIAGNOSIS — Z01818 Encounter for other preprocedural examination: Secondary | ICD-10-CM

## 2010-05-11 NOTE — Patient Instructions (Signed)
   Follow up in 3 months as scheduled. Your physician recommends that you continue on your current medications as directed. Please refer to the Current Medication list given to you today. 

## 2010-05-11 NOTE — Assessment & Plan Note (Signed)
Continue medical therapy 

## 2010-05-11 NOTE — Progress Notes (Signed)
Clinical Summary [name] [name] is a 77 y.o.male presenting for followup. He was last seen in the office in September 2011. Subsequent records were reviewed, with findings of acute stroke noted in January of this year, now on Coumadin, having refused it in the past. He is being evaluated by Dr. Hart Rochester with findings of carotid artery disease, 60-80% on the left, and is now considered for elective carotid endarterectomy.  History with his daughter. He has made progress since his stroke, living at home, with home health nursing and also physical therapy. Still has an expressive aphasia, also right-sided weakness. He uses a walker or cane to ambulate.  Denies any significant angina or unusual shortness of breath, nothing greater than NYHA class II. Last cardiac catheterization from April 2011 is noted below. Most recent echocardiogram done this year is noted below.  He reports compliance with medications, including Coumadin, no active bleeding problems. Occasional bruising.  His daughter states that she cut simvastatin dosing in half, to 40 mg, related to a rash on his back, subsequently resolved.   Allergies  Allergen Reactions  . Clopidogrel Bisulfate     REACTION: rash    Current outpatient prescriptions:amLODipine (NORVASC) 10 MG tablet, Take 10 mg by mouth daily.  , Disp: , Rfl: ;  Ascorbic Acid (VITAMIN C) 1000 MG tablet, Takes one tab 3 x week , Disp: , Rfl: ;  aspirin 81 MG tablet, Take 81 mg by mouth daily.  , Disp: , Rfl: ;  carvedilol (COREG) 6.25 MG tablet, Take 6.25 mg by mouth 2 (two) times daily with a meal.  , Disp: , Rfl: ;  co-enzyme Q-10 30 MG capsule, Takes one tab 3 x week , Disp: , Rfl:  docusate sodium (COLACE) 100 MG capsule, As needed.   , Disp: , Rfl: ;  mineral oil liquid, As needed.   , Disp: , Rfl: ;  Omega-3 Fatty Acids (FISH OIL) 1000 MG CAPS, Take by mouth daily.  , Disp: , Rfl: ;  oxybutynin (DITROPAN) 5 MG tablet, Take 5 mg by mouth 2 (two) times daily.  , Disp: , Rfl: ;   simvastatin (ZOCOR) 80 MG tablet, Take 1/2 tab (40mg ) every evening, Disp: , Rfl:  VITAMIN D, CHOLECALCIFEROL, PO, Takes one tab 3 x wee , Disp: , Rfl: ;  vitamin E 400 UNIT capsule, Takes one tab 3 x week , Disp: , Rfl: ;  warfarin (COUMADIN) 1 MG tablet, Take 1 mg by mouth as directed.  , Disp: , Rfl: ;  warfarin (COUMADIN) 5 MG tablet, Take 5 mg by mouth daily. Managed by Dr. Margo Common. , Disp: , Rfl:   Past Medical History  Diagnosis Date  . Coronary atherosclerosis of native coronary artery     Multivessel, LVEF 60%  . Essential hypertension, benign   . Myocardial infarction     NSTEMI  . Atrial fibrillation     Refused Coumadin until recent stroke  . Hearing loss   . Type 2 diabetes mellitus   . Sick sinus syndrome   . Stroke     January 2012, left frontoparietal infarct  . Carotid artery disease     Dr. Hart Rochester    Past Surgical History  Procedure Date  . Hernia repair   . Appendectomy   . Coronary artery bypass graft 2001    [place] - LIMA to LAD, SVG to OM, SVG to PDA  . Cholecystectomy   . Pacemaker placement 2011    [employer] RF SR  model [id number]    Family History  Problem Relation Age of Onset  . Coronary artery disease      Social History Mr. Cardozo reports that he has never smoked. He has never used smokeless tobacco. Mr. Dutton reports that he does not drink alcohol.  Review of Systems No complaint of palpitations or syncope. Stable appetite. Otherwise reviewed and negative.  Physical Examination Filed Vitals:   05/11/10 1121  BP: 146/75  Pulse: 60  Normally nourished appearing elderly male in no acute distress. HEENT: Conjunctiva and lids normal, oropharynx clear. Neck: Supple, no elevated JVP, soft left carotid bruit. Lungs: Clear to auscultation, nonlabored. Cardiac: Regular rate and rhythm with occasional ectopic beat, no S3 gallop or rub. Abdomen: Soft, nontender, bowel sounds present. Skin: Warm and dry. Musculoskeletal: No gross  deformities. Extremities: 1+ edema noted around the ankles, also right hand. Neuropsychiatric: Alert and oriented x3, expressive aphasia, right-sided residual weakness.  ECG Ventricular paced rhythm at 61 beats per minute.  Studies Echocardiogram 02/05/2010: - Left ventricle: The cavity size was normal. Wall thickness was     increased in a pattern of moderate LVH. Systolic function was     normal. The estimated ejection fraction was in the range of 55% to     60%.   - Aortic valve: Mild regurgitation.   - Mitral valve: Mild regurgitation.   - Left atrium: The atrium was moderately dilated.   - Right atrium: The atrium was moderately dilated.   - Atrial septum: No defect or patent foramen ovale was identified.  Cardiac catheterization 05/01/2009: Cardiac Cath Findings  ANGIOGRAPHIC DATA:   1. The left main is free of critical disease.   2. The LAD is somewhat diffusely diseased and then appears to be       severely diseased at the bifurcation of the small diagonal and then       the LAD.  No further distal vessel was seen.  This is after a       septal perforator.   3. The internal mammary to the distal LAD is in fact intact.  The       distal LAD is a very small-caliber vessel, diffusely diseased, and       does appear to provide flow to the posterior descending.  It is a       very small caliber diffusely diseased vessel.   4. The circumflex is a moderately large vessel.  There is a tiny first       marginal that has probably 50-70% proximal segmental plaque but is       small in caliber.  Following this, the vessel then opens up and       provides a bifurcating marginal with a very small-caliber vessel       and a subbranch with about 90% narrowing.  The third marginal is       essentially occluded.   5. The saphenous vein graft what appears to be the third marginal is       intact.  It was described as a diagonal OM graft, and the diagonal       takeoff is not seen.  The  actual diagonal may represent the       diagonal seen on the native left injection, but this is unknown       since the old films are not presently available.   6. The right coronary artery is somewhat diffusely diseased vessel  with 50% narrowing and then small calibered distally going into the       posterolateral segment which is extremely small and subtotally       occluded.  The PDA itself is not seen.  We believe we see the PDA       slowly filling in by retrograde collaterals from the distal LAD       injection.   7. Ventriculography in the RAO projection reveals vigorous global       systolic function with ejection fraction in excess of 60%.   8. Aortic root aortography reveals no aortic regurgitation.  The       aortic size may be minimally dilated but appears overall to be at       most upper normal in size.  Problem List and Plan

## 2010-05-11 NOTE — Assessment & Plan Note (Signed)
Patient being considered for elective left carotid endarterectomy under general anesthesia. He has been symptomatically stable from the perspective of coronary artery disease, last ischemic evaluation in April 2011, and with normal LVEF. Would anticipate that he should be able to proceed at an acceptable perioperative risk, mild to moderate overall, on medical therapy, with close observation.

## 2010-05-11 NOTE — Assessment & Plan Note (Signed)
Left carotid artery stenosis, followed by Dr. Hart Rochester,

## 2010-05-11 NOTE — Assessment & Plan Note (Signed)
Symptomatically stable on medical therapy, no active angina or heart failure symptoms. LVEF recently normal by followup echocardiogram, and cardiac catheterization from April 2011 is noted above.

## 2010-05-11 NOTE — Assessment & Plan Note (Signed)
Symptomatically stable, now on Coumadin, following stroke in January of this year.

## 2010-05-18 HISTORY — PX: CAROTID ENDARTERECTOMY: SUR193

## 2010-05-27 ENCOUNTER — Emergency Department (HOSPITAL_COMMUNITY): Payer: Medicare (Managed Care)

## 2010-05-27 ENCOUNTER — Emergency Department (HOSPITAL_COMMUNITY)
Admission: EM | Admit: 2010-05-27 | Discharge: 2010-05-27 | Disposition: A | Discharge status: Home / Self Care | Payer: Medicare (Managed Care) | Attending: Emergency Medicine | Admitting: Emergency Medicine

## 2010-05-27 DIAGNOSIS — Z79899 Other long term (current) drug therapy: Secondary | ICD-10-CM | POA: Insufficient documentation

## 2010-05-27 DIAGNOSIS — Z7901 Long term (current) use of anticoagulants: Secondary | ICD-10-CM | POA: Insufficient documentation

## 2010-05-27 DIAGNOSIS — E785 Hyperlipidemia, unspecified: Secondary | ICD-10-CM | POA: Insufficient documentation

## 2010-05-27 DIAGNOSIS — E1169 Type 2 diabetes mellitus with other specified complication: Secondary | ICD-10-CM | POA: Insufficient documentation

## 2010-05-27 DIAGNOSIS — R4701 Aphasia: Secondary | ICD-10-CM | POA: Insufficient documentation

## 2010-05-27 DIAGNOSIS — R339 Retention of urine, unspecified: Secondary | ICD-10-CM | POA: Insufficient documentation

## 2010-05-27 DIAGNOSIS — E876 Hypokalemia: Secondary | ICD-10-CM | POA: Insufficient documentation

## 2010-05-27 DIAGNOSIS — M6281 Muscle weakness (generalized): Secondary | ICD-10-CM | POA: Insufficient documentation

## 2010-05-27 DIAGNOSIS — Z8673 Personal history of transient ischemic attack (TIA), and cerebral infarction without residual deficits: Secondary | ICD-10-CM | POA: Insufficient documentation

## 2010-05-27 DIAGNOSIS — I10 Essential (primary) hypertension: Secondary | ICD-10-CM | POA: Insufficient documentation

## 2010-05-27 DIAGNOSIS — R4182 Altered mental status, unspecified: Secondary | ICD-10-CM | POA: Insufficient documentation

## 2010-05-27 DIAGNOSIS — R142 Eructation: Secondary | ICD-10-CM | POA: Insufficient documentation

## 2010-05-27 DIAGNOSIS — R141 Gas pain: Secondary | ICD-10-CM | POA: Insufficient documentation

## 2010-05-27 DIAGNOSIS — I2510 Atherosclerotic heart disease of native coronary artery without angina pectoris: Secondary | ICD-10-CM | POA: Insufficient documentation

## 2010-05-27 LAB — COMPREHENSIVE METABOLIC PANEL
BUN: 24 mg/dL — ABNORMAL HIGH (ref 6–23)
CO2: 25 mEq/L (ref 19–32)
Chloride: 103 mEq/L (ref 96–112)
Creatinine, Ser: 1 mg/dL (ref 0.4–1.5)
GFR calc non Af Amer: >60 mL/min (ref >60)
Glucose, Bld: 42 mg/dL — CL (ref 70–99)
Total Bilirubin: 0.2 mg/dL — ABNORMAL LOW (ref 0.3–1.2)

## 2010-05-27 LAB — URINE MICROSCOPIC-ADD ON

## 2010-05-27 LAB — CBC
MCH: 31.3 pg (ref 26.0–34.0)
MCHC: 34.7 g/dL (ref 30.0–36.0)
MCV: 90 fL (ref 78.0–100.0)
Platelets: 228 10*3/uL (ref 150–400)
RDW: 13.5 % (ref 11.5–15.5)
WBC: 11.8 10*3/uL — ABNORMAL HIGH (ref 4.0–10.5)

## 2010-05-27 LAB — GLUCOSE, CAPILLARY
Glucose-Capillary: 138 mg/dL — ABNORMAL HIGH (ref 70–99)
Glucose-Capillary: 175 mg/dL — ABNORMAL HIGH (ref 70–99)
Glucose-Capillary: 208 mg/dL — ABNORMAL HIGH (ref 70–99)

## 2010-05-27 LAB — URINALYSIS, ROUTINE W REFLEX MICROSCOPIC
Glucose, UA: 500 mg/dL — AB
Ketones, ur: NEGATIVE mg/dL
Leukocytes, UA: NEGATIVE
Nitrite: NEGATIVE
Protein, ur: NEGATIVE mg/dL
pH: 6 (ref 5.0–8.0)

## 2010-05-27 LAB — APTT: aPTT: 38 seconds — ABNORMAL HIGH (ref 24–37)

## 2010-05-27 LAB — POCT I-STAT, CHEM 8
BUN: 25 mg/dL — ABNORMAL HIGH (ref 6–23)
Creatinine, Ser: 1.3 mg/dL (ref 0.4–1.5)
Hemoglobin: 12.9 g/dL — ABNORMAL LOW (ref 13.0–17.0)
Potassium: 3.1 mEq/L — ABNORMAL LOW (ref 3.5–5.1)
Sodium: 141 mEq/L (ref 135–145)

## 2010-05-27 LAB — DIFFERENTIAL
Band Neutrophils: 2 % (ref 0–10)
Blasts: 0 %
Lymphocytes Relative: 17 % (ref 12–46)
Lymphs Abs: 2 10*3/uL (ref 0.7–4.0)
Monocytes Absolute: 0.1 10*3/uL (ref 0.1–1.0)
Monocytes Relative: 1 % — ABNORMAL LOW (ref 3–12)

## 2010-05-27 LAB — CK TOTAL AND CKMB (NOT AT ARMC)
CK, MB: 3.5 ng/mL (ref 0.3–4.0)
Relative Index: INVALID (ref 0.0–2.5)
Total CK: 71 U/L (ref 7–232)

## 2010-05-28 LAB — URINE CULTURE
Colony Count: NO GROWTH
Culture  Setup Time: 201205100909

## 2010-05-31 ENCOUNTER — Telehealth: Payer: Self-pay | Admitting: *Deleted

## 2010-05-31 NOTE — Telephone Encounter (Signed)
Pt's wife left message on voicemail asking for a return call.  Pt's wife states pt had an episode during the night Thursday night. She thought he was having a stroke. She took him to [facility] and they found his blood sugar was in the 40's. She states his abd was distended. She states they took off almost 2 liters of fluid (through catheter). She states this turned to blood. He went home with the catheter. Pt's wife emptied 2 bags (leg bag) that was bloody urine. Pt went to [facility] on Thursday night. Pt d/c'd from [facility] w/catheter. She states they stopped Coumadin and told him to see the urologist on Friday. He saw Dr. Baldo Ash on Friday who told pt to continue to hold Coumadin. Pt's wife states there is no more blood in the pt's urine. He has an appt w/Dr. Margo Common and Dr. Baldo Ash today.   Pt has carotid blockage and Dr. Hart Rochester wants to do surgery on this. They have an appt w/them on 5/29.   Pt's wife is concerned about pt being off of Coumadin w/Atrial Fib and Carotid Stenosis. Pt's wife notified the a.fib will be the bigger concern. She is aware that Dr. Baldo Ash should decide whether pt can resume Coumadin when they see him today. Pt's wife verbalized understanding.

## 2010-06-01 NOTE — Assessment & Plan Note (Signed)
[facility]                          [facility]   NAME:[name] [name], [name] [name]                          MRN:          [id number]  DATE:10/30/2007                            DOB:          13-Apr-1924    PRIMARY CARE PHYSICIAN:  Dr. Wyvonnia Lora.   REASON FOR VISIT:  Established cardiac followup.   HISTORY OF PRESENT ILLNESS:  [name] [name] is an 77 year old male with a  history of cardiovascular disease status post four-vessel coronary  artery bypass grafting in 2001 at a facility in [place], [place].  He  has had atrial fibrillation diagnosed since that time on a baseline  history of hypertension and diabetes mellitus.  My understanding is that  he was taking Coumadin for a period of time, but around March 2009 he  elected to discontinue the medication citing multiple complaints such as  dizziness, fuzziness, abdominal discomfort, and lower extremity edema.  He states that all of these symptoms have resolved since coming off of  Coumadin.  His CHAD2 score is 3, placing him at increased risk of stroke  with atrial fibrillation and making him actually a good candidate for a  long-term Coumadin.  I did review this with him today and discussed the  risk and benefit profile.  Symptomatically, otherwise, he denies having  any major sense of palpitations.  He has no angina, and has NYHA class  II dyspnea on exertion.  Today's electrocardiogram shows atrial  fibrillation at 58 beats per minute with poor septal R-wave progression  and nonspecific ST-T wave changes.  His previous tracing from September  2001 available in the chart is actually fairly similar with the  exception of findings of sinus rhythm at that time.  He reports being  followed by [facility] long-term and undergoing  both echocardiographic and nuclear perfusion imaging, as recently as  last year.  Details are forthcoming.   ALLERGIES:  No known drug allergies.   PRESENT MEDICATIONS:  1. Aspirin 162 mg p.o. b.i.d.  2. Carvedilol 3.125 mg p.o. b.i.d.  3. Lisinopril 20 mg p.o. b.i.d.  4. Glipizide ER 5 mg p.o. daily.  5. Simvastatin 80 mg p.o. nightly.  6. Nifedipine ER 30 mg p.o. daily.   PAST MEDICAL HISTORY:  Is detailed above.  He is status post  cholecystectomy in 2000, herniorrhaphy in the 1980s, and appendectomy in  the 1940s.  He apparently presented with a heart attack in November  2001.  He has had no subsequent cardiac-related hospitalization since  his surgery by report.   SOCIAL HISTORY:  The patient is married.  He has 5 children.  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  There is no alcohol or tobacco use history.   REVIEW OF SYSTEMS:  As outlined above.  He denies any frank  claudication.  He does have arthritic discomfort in his back, in legs,  some urinary hesitancy, and erectile dysfunction.  No bleeding  diathesis.  Otherwise negative.   PHYSICAL EXAMINATION:  VITAL SIGNS:  Blood pressure 162/79, heart  rate  in the 60s and irregular, and weight is 160 pounds.  GENERAL:  This is a well-nourished elderly male in no acute distress.  HEENT:  Conjunctivae are normal.  Oropharynx is clear.  NECK:  Supple.  No elevated jugular venous pressure.  No loud bruits.  No thyromegaly is noted.  LUNGS:  Clear without labored breathing at rest.  CARDIOVASCULAR:  Regularly irregular rhythm.  Soft systolic murmur.  No  S3 gallop.  No pericardial rub.  ABDOMEN:  Soft and nontender.  No loud bruits.  No pulsatile mass.  Bowel sounds were present.  EXTREMITIES:  Exhibit no significant pitting edema.  Distal pulses 1+.  SKIN:  Warm and dry.  MUSCULOSKELETAL:  Mild kyphosis noted.  NEUROPSYCHIATRIC:  The patient is alert and oriented x3.  Affect is  appropriate.   IMPRESSION AND RECOMMENDATIONS:  1. Reported history of multivessel cardiovascular disease status post      four-vessel coronary artery  bypass grafting at a facility in      [place], [place] back in 2001.  Details are forthcoming.  We will      request cardiac records from [facility],      specifically centering on recent nuclear perfusion imaging and      echocardiography to better understand the patient's progress.      Symptomatically, he seems quite stable on the present medical      regimen and I will not make any specific changes today.  2. Presumably permanent atrial fibrillation.  The patient's CHAD2      score is 3 and typically I would recommend long-term Coumadin,      particularly in this active gentleman.  He is very hesitant to take      rat poison and therefore prefers to stay on an aspirin alone.  I      did explained to him clearly that the stroke prophylaxis with      aspirin versus Coumadin in his case would not be as optimal.      Otherwise, he is not reporting any major palpitations.  Need to      look out for significant bradycardia in this gentleman.  It may be      that over time he could be taken off the nifedipine.  3. Hyperlipidemia, on statin therapy.  Goal LDL would be around 70.  4. Disposition.  Followup will be in the next 6 months.     Jonelle Sidle, MD  Electronically Signed    SGM/MedQ  DD: 10/30/2007  DT: 10/31/2007  Job #: [id number]   cc:   Wyvonnia Lora

## 2010-06-01 NOTE — Procedures (Signed)
CAROTID DUPLEX EXAM   INDICATION:  Carotid stenosis.   HISTORY:  Diabetes:  Yes.  Cardiac:  MI, CABG x4, pacemaker.  Hypertension:  Yes.  Smoking:  No.  Previous Surgery:  No.  CV History:  Asymptomatic.  Amaurosis Fugax No, Paresthesias No, Hemiparesis No.                                       RIGHT             LEFT  Brachial systolic pressure:         140               144  Brachial Doppler waveforms:         Normal            Normal  Vertebral direction of flow:                          Antegrade  DUPLEX VELOCITIES (cm/sec)  CCA peak systolic                                     71  ECA peak systolic                                     54  ICA peak systolic                                     317  ICA end diastolic                                     93  PLAQUE MORPHOLOGY:                                    Mixed  PLAQUE AMOUNT:                                        Moderate-to-severe  PLAQUE LOCATION:                                      ICA   IMPRESSION:  1. Doppler velocities suggest high-end of 60% to 79% stenosis in the      left internal carotid artery.  2. Left vertebral artery is antegrade flow.        ___________________________________________  [name], M.D.   NT/MEDQ  D:  06/29/2009  T:  06/29/2009  Job:  [id number]

## 2010-06-01 NOTE — Assessment & Plan Note (Signed)
[facility]                          [facility]   NAME:[name], [name]                          MRN:          [id number]  DATE:05/19/2008                            DOB:          1924-01-23    PRIMARY CARE PHYSICIAN:  Wyvonnia Lora.   REASON FOR VISIT:  Routine cardiac followup.   HISTORY OF PRESENT ILLNESS:  I saw [name] [name] back in October 2010.  He  established with our practice at that time and was previously followed  in [place], [place].  He has a history of remote anterior wall  myocardial infarction status post coronary artery bypass grafting  including a LIMA to left anterior descending, saphenous vein graft to  the diagonal and circumflex and saphenous vein graft to the posterior  descending branch of the right coronary artery in 2001.  He also has  persistent atrial fibrillation diagnosed back in 2006 and wishes not to  take Coumadin therapy.  Overall, he reports doing very well.  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  He is not reporting any problems  with angina or progressive breathlessness beyond NYHA class II.  He does  have an automated blood pressure cuff and notices some low heart rates  in the morning, sometimes in the 50s; although, he is not particularly  symptomatic with this and has had no dizziness or syncope.  He reports  no rapid palpitations.  He has had some trouble with seasonal allergies  recently.  Today, I reviewed his medications and we talked about some  potential adjustments that we might make over time.  His records  indicate previous reassuring myocardial perfusion study in September  2006 with no active ischemia and hypertensive response.  Echocardiography in November 2008 showed a left ventricular ejection  fraction of 55% with biatrial enlargement.  He has had previous  documentation of mild mitral regurgitation, mild  aortic regurgitation,  and mild tricuspid regurgitation with normal pulmonary artery systolic  pressure.   ALLERGIES:  No known drug allergies.   PRESENT MEDICATIONS:  1. Aspirin 81 mg p.o. daily.  2. Carvedilol 3.125 mg p.o. b.i.d.  3. Lisinopril 20 mg p.o. b.i.d.  4. Glipizide ER 5 mg p.o. daily.  5. Simvastatin 80 mg p.o. nightly.  6. Nifedipine ER 30 mg p.o. b.i.d.   REVIEW OF SYSTEMS:  As outlined above.  Otherwise negative.   PHYSICAL EXAMINATION:  VITAL SIGNS:  Blood pressure today is 162/85,  heart rate is 69 and irregular, respirations 18, weight is 165 pounds.  GENERAL:  The patient is comfortable in no acute distress.  NECK:  No elevated jugular venous pressure.  LUNGS:  Clear on breathing.  CARDIAC:  Irregularly irregular rhythm.  Soft systolic murmur.  No S3.  ABDOMEN:  Soft, nontender.  EXTREMITIES:  Trace symmetrical lower extremity edema.  Distal pulse is  1+.   IMPRESSION AND RECOMMENDATION:  1. Cardiovascular disease status post previous anterior wall      myocardial infarction status post coronary artery bypass grafting  in 2001 as detailed above.  Ejection fraction based on assessment      over the last 4 years has been in the normal range and there was no      active ischemia documented 4 years ago on myocardial perfusion      imaging.  Symptomatically, [name] [name] is very stable at this time.      I have recommended continuing medical therapy, and I will plan to      see him back in 6 months, likely for consideration of repeat      ischemic testing at that time.  2. Permanent atrial fibrillation.  The patient prefers to avoid      Coumadin.  This has been discussed with him in the past.  His      CHADS2 score is 3.  He will continue on aspirin at this time.  I      think it might make sense with relatively low heart rates to      eventually take him off of nifedipine ER and use carvedilol alone      for rate control with the addition of Norvasc  potentially for      concurrent blood pressure management.  At this point, he is going      to decrease his nifedipine ER to once daily dosing.  3. Hyperlipidemia, on statin therapy.  The patient had a followup      visit with Dr. Margo Common in the near future.     Jonelle Sidle, MD  Electronically Signed    SGM/MedQ  DD: 05/19/2008  DT: 05/20/2008  Job #: [id number]   cc:   Wyvonnia Lora

## 2010-06-01 NOTE — Consult Note (Signed)
[name] [name] NO.:  [id number]  MEDICAL RECORD NO.:  [id number]           PATIENT TYPE:  E  LOCATION:  [facility]                         FACILITY:  [facility]  PHYSICIAN:  [name] [name], M.D.  DATE OF BIRTH:  1924-05-14  DATE OF CONSULTATION:  05/27/2010 DATE OF DISCHARGE:                                CONSULTATION   HISTORY OF PRESENT ILLNESS:  [name] [name] is an 77 year old right handed white male, born on 1924-04-17, with a history of cerebrovascular disease, atrial fibrillation and coronary artery disease as well as diabetes and hypertension.  This patient lives in the [place], [place] area and was in [facility] in January 2012 when he sustained a left frontotemporal stroke at that point.  The patient was placed on Coumadin for his atrial fibrillation and was managed with Lantus insulin for his diabetes.  This patient has returned home with some deficits with right hemiparesis, a residual aphasia.  The patient was given his usual 14 units of Lantus insulin a little bit late this last evening around 12:30 a.m. on the day of evaluation.  The patient was last seen normal at that time.  The patient was noted around 2 o'clock in the morning today to be trying to get up out of bed.  The patient seemed to require assistance and when trying to get back from the bathroom, the patient was noted to have some increased weakness and slurred speech. The patient was brought to the emergency room by the wife.  Upon arrival, blood work revealed a blood sugar of 42.  The patient was given D50 and seemed to return back to baseline.  CT scan of the brain shows evidence of old left frontoparietal and temporal infarcts, and old left cerebellar infarct.  No acute changes were seen.  The patient was also noted to have urinary retention, which is by history has been present for at least 1 week.  The patient was evaluated by Neurology secondary to a code stroke that  was called.  NIH stroke scale score is 7.  The patient is felt to be at his baseline.  PAST MEDICAL HISTORY:  Significant for: 1. Left brain stroke in January 2012. 2. Atrial fibrillation, on Coumadin. 3. Pacemaker placement. 4. CABG procedure, coronary artery disease. 5. Diabetes. 6. Hypertension. 7. Dyslipidemia. 8. Urinary retention.  The patient has history of decreased auditory     acuity. 9. History of appendectomy. 10.Gallbladder resection.  MEDICATIONS: 1. Fish oil. 2. Coreg 3.125 mg twice daily. 3. Zocor 40 mg one half tablet daily. 4. Sublingual nitroglycerin if needed. 5. Lantus insulin 14 units subcu at night. 6. Norvasc 10 mg daily. 7. Aspirin 81 mg daily. 8. Coumadin 6 mg daily. 9. Ditropan 5 mg twice daily.  The patient has an allergy to PLAVIX.  Does not smoke or drink.  FAMILY MEDICAL HISTORY:  Mother died with a myocardial infarction. Father died with a myocardial infarction.  The patient has seven brothers and sisters.  Several siblings have history of heart disease and stroke.  SOCIAL HISTORY:  The patient is currently married,  lives in [place], [place] area.  Has five children who are alive and well.  The patient is retired.  REVIEW OF SYSTEMS:  Notable for no recent fevers or chills.  The patient did have a recent bladder infection.  The patient denies shortness of breath, chest pains, has had some lower abdominal pains, urinary retention, swelling in the legs, knee pains.  PHYSICAL EXAMINATION:  VITAL SIGNS:  Blood pressure is 156/66, heart rate 60, respiratory rate 14, temperature afebrile. GENERAL:  The patient is a fairly well-developed white male who is alert and cooperative at the time of examination. HEENT:  Head is atraumatic.  Eyes:  Pupils are equal, round, and reactive to light. NECK:  Supple.  No carotid bruits noted. RESPIRATORY:  Clear. CARDIOVASCULAR:  Reveals occasionally irregular heart rhythm.  No obvious murmurs or rubs  noted. ABDOMEN:  Reveals distended bladder in the lower abdomen, positive bowel sounds noted. EXTREMITIES:  Reveal 2+ edema at the ankles and lower extremities below the knees. NEUROLOGIC:  Has mild facial asymmetry, decreased nasolabial fold on the right.  Extraocular movements are full.  Visual fields reveals a right homonymous visual field deficit.  Speech is dysarthric and aphasic.  The patient has flexion of the fingers of the right hand with good proximal strength at both arm and leg on the right with normal strength on the left side.  The patient has fair finger-nose-finger, heel-to-shin bilaterally.  Some apraxia with use of the lower extremities is noted. The patient has symmetric reflexes, but upgoing toe on the left, downgoing on the right.  The patient has good pinprick, soft touch, vibratory sensation throughout.  No evidence of extinctions noted.  The patient was not ambulated.  NIH stroke scale score is 7.  Laboratory values notable for a INR of 2.85.  White count 11.8, hemoglobin 12.5, hematocrit 36.0, MCV of 90.0, platelets of 228.  Sodium 138, potassium 3.0, chloride of 103, CO2 of 25, glucose of 42, BUN of 24, creatinine 1.0, alkaline phosphatase 58, SGOT of 31, SGPT of 40. Total protein 7.55, albumin 3.8, calcium 9.4.  CK of 71, troponin-I less than 0.3.  CT of the head shows old infarcts in the left frontotemporoparietal area and left cerebellum, no acute changes seen.  IMPRESSION: 1. Transient weakness secondary to hypoglycemia. 2. Urinary retention.  This patient is at baseline following a D50 administration.  The patient likely had worsening of clinical deficits associated with hypoglycemia. At this point, no further neurologic workup is indicated.  Code Stroke will be cancelled.  I will see this patient again if needed.  Suppose it is possible, the patient could be discharge with an indwelling catheter if felt appropriate.     [name] [name],  M.D.     CKW/MEDQ  D:  05/27/2010  T:  05/27/2010  Job:  [id number]  cc:   [facility] Jonelle Sidle, MD  Electronically Signed by Thana Farr M.D. on 06/01/2010 08:18:35 AM

## 2010-06-01 NOTE — Consult Note (Signed)
NEW PATIENT CONSULTATION   [name] [name]  DOB:  October 17, 1924                                       06/29/2009  ZOXWR#:[id number]   [name] [name] is an 77 year old male patient referred for evaluation of  carotid occlusive disease by Dr. Simona Huh.  The patient has a  history of coronary artery disease, recently had non-ST myocardial  infarction in April and also suffered bradycardia.  He had a cardiac  catheterization which revealed patency of cardiac grafts which were  placed in 2001 in [place].  He had a pacemaker inserted as well.  He was  found to have a 70-80% left internal carotid stenosis.  he denies any  hemispheric or nonhemispheric TIAs, amaurosis fugax, diplopia, blurred  vision or syncope but has had some mild dizziness.  He has no history of  stroke.   Chronic medical problems:  1. Diabetes mellitus non-insulin dependent.  2. Hypertension.  3. Coronary artery disease.  4. Bradycardia treated by pacemaker.  5. Negative for COPD or stroke.   SOCIAL HISTORY:  The patient is a Education officer, environmental.  He is married, has 7  children.  Does not use tobacco or alcohol.   FAMILY HISTORY:  Positive strongly for coronary artery disease in both  parents and multiple siblings as well as stroke in 2 siblings and  diabetes in all family members.   REVIEW OF SYSTEMS:  Positive for weight loss, occasional dizziness,  occasional wheezing, arthritis, joint pain, dyspnea on exertion and  history of arrhythmia and atrial fibrillation in the past, not  currently, urinary frequency.  All other systems in the complete review  of systems are negative.   PHYSICAL EXAMINATION:  Blood pressure 162/82, heart rate 72,  respirations 16.  GENERAL:  Well-developed, well-nourished male in no apparent distress,  alert and oriented x3.  HEENT:  Exam is normal.  EOMs intact.  LUNGS:  Clear to auscultation.  No wheezing.  CARDIOVASCULAR:  Regular rhythm.  No murmurs.  There is a soft bruit on  the left over his carotid vessels.  There is a pacemaker palpable in the  left upper chest wall.  ABDOMEN:  Soft, nontender, no masses.  MUSCULOSKELETAL:  Exam is free of major deformities.  NEUROLOGIC:  Normal.  SKIN:  Free of rashes.   Today I reviewed the carotid duplex study performed on the 2nd which is  suggestive of 80% left internal carotid stenosis and mild flow reduction  on the right side.  There were no end-diastolic pressures measured on  that study.  Therefore I have repeated the left carotid today.  It  appears to me that the stenosis is in the 70-80% range.  He is  asymptomatic.   IMPRESSION:  70-80% left internal carotid stenosis currently  asymptomatic with recent non-ST and pacemaker exertion.   I think we can safely follow this at least for the neck 6 months since  he did have a recent cardiac event and his carotid disease is  asymptomatic.  I will see him in 6 months for follow-up carotid duplex  exam at that time to be performed in our office to compare it to today's  study to see the status of his disease.  He will likely require carotid  endarterectomy in the near future.     [name], M.D.  Electronically Signed   JDL/MEDQ  D:  06/29/2009  T:  06/30/2009  Job:  [id number]   cc:   Nita Sells, MD

## 2010-06-08 ENCOUNTER — Ambulatory Visit (INDEPENDENT_AMBULATORY_CARE_PROVIDER_SITE_OTHER): Payer: Medicare (Managed Care) | Admitting: Vascular Surgery

## 2010-06-08 DIAGNOSIS — I6529 Occlusion and stenosis of unspecified carotid artery: Secondary | ICD-10-CM

## 2010-06-09 NOTE — H&P (Signed)
HISTORY AND PHYSICAL EXAMINATION  Jun 08, 2010  Re:  [name] [name]                   DOB:  1924/07/17  CHIEF COMPLAINT:  Severe left internal carotid stenosis, previous left brain CVA in January 2012.  HISTORY OF PRESENT ILLNESS:  This is a 77 year old male patient who suffered a left brain stroke in January 2012.  He has a known 70% to 80% left internal carotid stenosis but had previously suffered a non-Q MI 2 months prior to his evaluation in June 2011, had a pacemaker inserted because of some bradycardia.  He had coronary bypass graft done in 2001 in [place].  Unfortunately, he suffered a left brain stroke in January from which he has recovered significantly.  This consisted of the right upper extremity paralysis and aphasia with some mild right lower extremity weakness, all of which is significantly improved.  He has been evaluated by Dr. Diona Browner who has given cardiac clearance for left carotid endarterectomy.  CHRONIC MEDICAL PROBLEMS: 1. Type 1 diabetes mellitus. 2. Hypertension. 3. Coronary artery disease, previous coronary artery bypass grafting     with history of bradycardia currently with pacemaker. 4. Chronic atrial fibrillation on Coumadin.  SOCIAL HISTORY:  He is a Education officer, environmental.  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  He does not use tobacco or alcohol.  FAMILY HISTORY:  Strongly positive for coronary artery disease in both parents and multiple siblings well as stroke in 2 siblings and diabetes in all family members.  REVIEW OF SYSTEMS:  Denies any dysphagia.  Does have some weight loss, occasional dizziness, wheezing, joint pain, arthritis, chronic atrial fib and recently had some urinary problems and catheter was placed by his urologist, which is to remain in place until postoperatively and he will need prostate surgery.  All other systems are negative.  PHYSICAL EXAMINATION:  Vital signs:  Blood pressure is 142/63, heart rate 83, respirations 20.   GENERAL:  This is a well-developed, well- nourished male in no apparent distress, alert and oriented x3.  HEENT: Normal for age.  EOMs intact.  Has an obvious right facial weakness. Lungs:  Clear to auscultation.  No rhonchi or wheezing.  Cardiovascular: Irregularly irregular rhythm, no murmurs.  Carotid pulses are 3+ with a harsh bruit on the left.  No bruit on the right.  He has a pacemaker in place in the left clavicular area.  Abdomen:  Soft, nontender with no masses.  Musculoskeletal:  Free of major deformities.  Neurologic: Moderate paresis of his right upper extremity and right face with some mild aphasia.  Fairly good strength in right lower extremity.  Skin: Free of rashes.  I have reviewed the cardiac evaluation by Dr. Diona Browner which revealed he is stable from a cardiac standpoint with normal ejection fraction and was thought to be an acceptable risk for left carotid endarterectomy.  IMPRESSION:  Severe left internal carotid stenosis, previous left brain cerebrovascular accident January 2012, admitted now for left carotid endarterectomy.  Coumadin will be discontinued 5 days prior to admission.  Risks and benefits have been thoroughly discussed with the patient and his wife and he would like to proceed.  Surgery is scheduled for Wednesday,May 30.    [name] [name], M.D. Electronically Signed  JDL/MEDQ  D:  06/08/2010  T:  06/09/2010  Job:  [id number]

## 2010-06-10 ENCOUNTER — Other Ambulatory Visit (HOSPITAL_COMMUNITY): Payer: Medicare (Managed Care)

## 2010-06-11 ENCOUNTER — Ambulatory Visit (HOSPITAL_COMMUNITY)
Admission: RE | Admit: 2010-06-11 | Discharge: 2010-06-11 | Disposition: A | Discharge status: Home / Self Care | Payer: Medicare (Managed Care) | Source: Ambulatory Visit | Attending: Vascular Surgery | Admitting: Vascular Surgery

## 2010-06-11 ENCOUNTER — Encounter (HOSPITAL_COMMUNITY)
Admission: RE | Admit: 2010-06-11 | Discharge: 2010-06-11 | Disposition: A | Discharge status: Home / Self Care | Payer: Medicare (Managed Care) | Source: Ambulatory Visit | Attending: Vascular Surgery | Admitting: Vascular Surgery

## 2010-06-11 ENCOUNTER — Other Ambulatory Visit: Payer: Self-pay | Admitting: Vascular Surgery

## 2010-06-11 DIAGNOSIS — I6522 Occlusion and stenosis of left carotid artery: Secondary | ICD-10-CM

## 2010-06-11 DIAGNOSIS — Z01818 Encounter for other preprocedural examination: Secondary | ICD-10-CM | POA: Insufficient documentation

## 2010-06-11 DIAGNOSIS — Z01812 Encounter for preprocedural laboratory examination: Secondary | ICD-10-CM | POA: Insufficient documentation

## 2010-06-11 DIAGNOSIS — Z01811 Encounter for preprocedural respiratory examination: Secondary | ICD-10-CM | POA: Insufficient documentation

## 2010-06-11 DIAGNOSIS — I6529 Occlusion and stenosis of unspecified carotid artery: Secondary | ICD-10-CM | POA: Insufficient documentation

## 2010-06-11 LAB — SURGICAL PCR SCREEN
MRSA, PCR: NEGATIVE
Staphylococcus aureus: NEGATIVE

## 2010-06-11 LAB — COMPREHENSIVE METABOLIC PANEL
ALT: 28 U/L (ref 0–53)
Albumin: 3.4 g/dL — ABNORMAL LOW (ref 3.5–5.2)
Calcium: 9.4 mg/dL (ref 8.4–10.5)
Glucose, Bld: 151 mg/dL — ABNORMAL HIGH (ref 70–99)
Sodium: 137 mEq/L (ref 135–145)
Total Protein: 6.5 g/dL (ref 6.0–8.3)

## 2010-06-11 LAB — CBC
HCT: 35.2 % — ABNORMAL LOW (ref 39.0–52.0)
MCH: 30.9 pg (ref 26.0–34.0)
MCHC: 33.8 g/dL (ref 30.0–36.0)
MCV: 91.4 fL (ref 78.0–100.0)
Platelets: 213 10*3/uL (ref 150–400)
RDW: 13.5 % (ref 11.5–15.5)

## 2010-06-11 LAB — URINALYSIS, ROUTINE W REFLEX MICROSCOPIC
Bilirubin Urine: NEGATIVE
Ketones, ur: NEGATIVE mg/dL
Nitrite: POSITIVE — AB
Protein, ur: 100 mg/dL — AB
Specific Gravity, Urine: 1.016 (ref 1.005–1.030)
Urobilinogen, UA: 0.2 mg/dL (ref 0.0–1.0)

## 2010-06-11 LAB — URINE MICROSCOPIC-ADD ON

## 2010-06-11 LAB — PROTIME-INR
INR: 1.67 — ABNORMAL HIGH (ref 0.00–1.49)
Prothrombin Time: 19.9 seconds — ABNORMAL HIGH (ref 11.6–15.2)

## 2010-06-11 LAB — APTT: aPTT: 34 seconds (ref 24–37)

## 2010-06-15 ENCOUNTER — Ambulatory Visit: Payer: Medicare (Managed Care) | Admitting: Vascular Surgery

## 2010-06-16 ENCOUNTER — Other Ambulatory Visit: Payer: Self-pay | Admitting: Vascular Surgery

## 2010-06-16 ENCOUNTER — Inpatient Hospital Stay (HOSPITAL_COMMUNITY)
Admission: RE | Admit: 2010-06-16 | Discharge: 2010-06-18 | DRG: 039 | Disposition: A | Discharge status: Home / Self Care | Payer: Medicare (Managed Care) | Source: Ambulatory Visit | Attending: Vascular Surgery | Admitting: Vascular Surgery

## 2010-06-16 DIAGNOSIS — I69998 Other sequelae following unspecified cerebrovascular disease: Secondary | ICD-10-CM

## 2010-06-16 DIAGNOSIS — R29898 Other symptoms and signs involving the musculoskeletal system: Secondary | ICD-10-CM | POA: Diagnosis present

## 2010-06-16 DIAGNOSIS — Z7982 Long term (current) use of aspirin: Secondary | ICD-10-CM

## 2010-06-16 DIAGNOSIS — Z7901 Long term (current) use of anticoagulants: Secondary | ICD-10-CM

## 2010-06-16 DIAGNOSIS — I4891 Unspecified atrial fibrillation: Secondary | ICD-10-CM | POA: Diagnosis present

## 2010-06-16 DIAGNOSIS — E109 Type 1 diabetes mellitus without complications: Secondary | ICD-10-CM | POA: Diagnosis present

## 2010-06-16 DIAGNOSIS — Z950 Presence of cardiac pacemaker: Secondary | ICD-10-CM

## 2010-06-16 DIAGNOSIS — I10 Essential (primary) hypertension: Secondary | ICD-10-CM | POA: Diagnosis present

## 2010-06-16 DIAGNOSIS — Z794 Long term (current) use of insulin: Secondary | ICD-10-CM

## 2010-06-16 DIAGNOSIS — I252 Old myocardial infarction: Secondary | ICD-10-CM

## 2010-06-16 DIAGNOSIS — I6529 Occlusion and stenosis of unspecified carotid artery: Principal | ICD-10-CM | POA: Diagnosis present

## 2010-06-16 DIAGNOSIS — I2510 Atherosclerotic heart disease of native coronary artery without angina pectoris: Secondary | ICD-10-CM | POA: Diagnosis present

## 2010-06-16 DIAGNOSIS — Z951 Presence of aortocoronary bypass graft: Secondary | ICD-10-CM

## 2010-06-16 DIAGNOSIS — I69920 Aphasia following unspecified cerebrovascular disease: Secondary | ICD-10-CM

## 2010-06-16 DIAGNOSIS — N400 Enlarged prostate without lower urinary tract symptoms: Secondary | ICD-10-CM | POA: Diagnosis present

## 2010-06-16 LAB — GLUCOSE, CAPILLARY: Glucose-Capillary: 296 mg/dL — ABNORMAL HIGH (ref 70–99)

## 2010-06-17 LAB — BASIC METABOLIC PANEL
Calcium: 8.2 mg/dL — ABNORMAL LOW (ref 8.4–10.5)
GFR calc Af Amer: >60 mL/min (ref >60)
GFR calc non Af Amer: 50 mL/min — ABNORMAL LOW (ref >60)
Glucose, Bld: 191 mg/dL — ABNORMAL HIGH (ref 70–99)
Sodium: 134 mEq/L — ABNORMAL LOW (ref 135–145)

## 2010-06-17 LAB — GLUCOSE, CAPILLARY
Glucose-Capillary: 343 mg/dL — ABNORMAL HIGH (ref 70–99)
Glucose-Capillary: 427 mg/dL — ABNORMAL HIGH (ref 70–99)

## 2010-06-17 LAB — PROTIME-INR
INR: 1.19 (ref 0.00–1.49)
Prothrombin Time: 15.3 seconds — ABNORMAL HIGH (ref 11.6–15.2)

## 2010-06-17 LAB — CBC
Platelets: 175 10*3/uL (ref 150–400)
RBC: 3.24 MIL/uL — ABNORMAL LOW (ref 4.22–5.81)
WBC: 8.2 10*3/uL (ref 4.0–10.5)

## 2010-06-18 LAB — CROSSMATCH
Antibody Screen: NEGATIVE
Unit division: 0

## 2010-06-18 LAB — GLUCOSE, CAPILLARY
Glucose-Capillary: 224 mg/dL — ABNORMAL HIGH (ref 70–99)
Glucose-Capillary: 237 mg/dL — ABNORMAL HIGH (ref 70–99)

## 2010-06-18 NOTE — Discharge Summary (Addendum)
NAMESHIVANSH, HARDAWAY NO.:  [id number]  MEDICAL RECORD NO.:  [id number]           PATIENT TYPE:  I  LOCATION:  3302                         FACILITY:  [facility]  PHYSICIAN:  Quita Skye. Hart Rochester, M.D.  DATE OF BIRTH:  04-26-1924  DATE OF ADMISSION:  06/16/2010 DATE OF DISCHARGE:  06/18/2010                              DISCHARGE SUMMARY   CHIEF COMPLAINT:  Severe left carotid stenosis with previous stroke in January 2012.  HISTORY OF PRESENT ILLNESS:  Mr. Holifield is an 77 year old gentleman who suffered a left brain stroke in January 2012.  He had a known 70-80% left internal carotid artery stenosis, but had previously suffered a non- Q MI 2 months prior to his evaluation.  In June 2011, he had a pacemaker inserted because of bradycardia.  He had coronary bypass done in 2001, in [place], [place].  Unfortunately, he suffered a left brain stroke in January 2012, from which he has recovered significantly.  He had right upper extremity paralysis and aphasia with mild right lower extremity weakness, all of which have significantly improved. He still has weakness in the right upper extremity with mild weakness in thr RLE.  He is getting home PT.  His speech is somewhat guarded but appropriate.  He was admitted for an elective left carotid endarterectomy after cardiac clearance from Dr. Diona Browner.  PAST MEDICAL HISTORY: 1. Type 1 diabetes. 2. Hypertension. 3. Coronary artery disease with previous coronary bypass grafting and     bradycardia with pacemaker. 4. Chronic atrial fibrillation on Coumadin. 5. Left Hemispheric CVA 6. BPH with chronic foley  HOSPITAL COURSE:  The patient was taken to the operating room on Jun 16, 2010, for a left carotid endarterectomy with Dacron patch angioplasty by Dr. Hart Rochester.  Postoperatively, the patient did well.  He remained in his usual neuro status with some right upper extremity weakness and minimal right lower extremity weakness which  was felt to be slightly more prominent from pre-op.  His speech was stable. His vital signs were stable.  He was afebrile.  His left neck was soft.  His wounds were healing well. He denied difficulty swallowing and had no headache.  He had no tongue deviation.  H and H was stable at 10.2 and 29.7. By POD #2 his symptoms had improved and he was walking with a walker  and felt his strength was much improved from POD#1. He will be discharged home.  DISCHARGE DIAGNOSES: 1. Left brain stroke with critical left carotid stenosis status post     left carotid endarterectomy. 2. Chronic benign prostatic hypertrophy with chronic Foley.  3. All his other medical conditions were stable and treated with his     preoperative medications while in-house.  DISPOSITION:  He will be discharged home and followed in 2 weeks by Dr. Hart Rochester.  DISCHARGE MEDICATIONS: 1. Oxycodone 5/325, 1-2 tabs every 4 hours as needed for pain. 2. Amlodipine 10 mg daily. 3. Aspirin 81 mg daily. 4. Carvedilol 6.25 mg twice daily with meals. 5. Finasteride 5 mg daily. 6. Fish oil daily. 7. Lantus 14 units daily at bedtime. 8. Nitroglycerin  as needed for chest pain sublingual 0.4 mg. 9. NovoLog sliding scale 3-5 units 3 times a day with meals. 10.Oxybutynin 5 mg 1 tablet twice daily. 11.Simvastatin 80 mg 1/2 a tablet daily at bedtime. 12.Coumadin 6 mg total daily.     Della Goo, PA-C   ______________________________ [name], M.D.    RR/MEDQ  D:  06/17/2010  T:  06/17/2010  Job:  [id number]  Electronically Signed by Della Goo PA on 06/18/2010 08:54:59 AM Electronically Signed by Josephina Gip M.D. on 06/21/2010 10:49:00 AM

## 2010-06-21 NOTE — Op Note (Signed)
[name] [name], [name] NO.:  [id number]  MEDICAL RECORD NO.:  [id number]           PATIENT TYPE:  I  LOCATION:  3302                         FACILITY:  [facility]  PHYSICIAN:  Quita Skye. Hart Rochester, M.D.  DATE OF BIRTH:  [birth year]  DATE OF PROCEDURE:  06/16/2010 DATE OF DISCHARGE:                              OPERATIVE REPORT   PREOPERATIVE DIAGNOSIS:  Status post left brain stroke with severe left internal carotid stenosis.  POSTOPERATIVE DIAGNOSIS:  Status post left brain stroke with severe left internal carotid stenosis.  OPERATIONS:  Left carotid endarterectomy with Dacron patch angioplasty.  SURGEON:  Quita Skye. Hart Rochester, MD  FIRST ASSISTANT:  Newton Pigg, [place]  ANESTHESIA:  General endotracheal.  BRIEF HISTORY:  This patient suffered a left brain stroke in January 2012.  He was known to have carotid occlusive disease and at the time of the stroke, his followup study revealed an 80-90% left internal carotid stenosis.  He was scheduled for a left carotid endarterectomy.  PROCEDURE:  The patient was taken to the operating room and placed in supine position, at which time, satisfactory general endotracheal anesthesia was administered.  Left neck was prepped with Betadine scrub and solution and draped in routine sterile manner.  The patient had a pacemaker/defibrillator in place, which had a magnet placed overlying it in the nonsterile field.  Incision was made along the anterior border of the sternocleidomastoid muscle on the left side, carried down through the subcutaneous tissue and platysma using the Bovie.  The common facial vein and external jugular veins were ligated with 3-0 silk ties and divided exposing the common, internal and external carotid arteries. Care was taken not to injure the vagus or hypoglossal nerves, both of which were exposed.  There was calcified atherosclerotic plaque at the carotid bifurcation extending up only about 2-3 cm, distal  vessel appeared normal.  A #10 shunt was prepared and the patient was heparinized.  Carotid vessels were occluded with vascular clamps. Longitudinal opening made in the common carotid with 15 blade, extended up to the internal carotid with Pott scissors to a point distal to the disease.  The plaque was very focal, soft and friable, but very stenotic about 90%.  There was excellent backbleeding from above.  A #10 shunt was inserted without difficulty reestablishing flow in about 2 minutes. Standard endarterectomy was then performed using the elevator and a Pott scissors with an eversion endarterectomy of the external carotid.  The plaque feathered off distal internal carotid artery nicely not requiring any tacking sutures.  Lumen was thoroughly irrigated with heparin saline.  All loose debris carefully removed and arteriotomy was closed with a patch using continuous 6-0 Prolene.  Prior to completion of the closure, shunt was removed after about 30 minutes of shunt time. Following antegrade and retrograde flushing, closure was completed through reestablishing of flow, initially up the external and up the internal branch.  Carotid was occluded for less than 2 minutes for removal of shunt.  Protamine was then given to reverse the heparin.  Following adequate hemostasis, wound was irrigated with saline, closed in layers with Vicryl  in subcuticular fashion. Sterile dressing was applied.  The patient was taken to the recovery room in satisfactory condition.     [name], M.D.     JDL/MEDQ  D:  06/16/2010  T:  06/16/2010  Job:  [id number]  Electronically Signed by Josephina Gip M.D. on 06/21/2010 10:48:56 AM

## 2010-06-29 ENCOUNTER — Ambulatory Visit: Payer: Medicare (Managed Care) | Admitting: Vascular Surgery

## 2010-07-01 ENCOUNTER — Encounter: Payer: Self-pay | Admitting: Cardiology

## 2010-07-13 ENCOUNTER — Ambulatory Visit (INDEPENDENT_AMBULATORY_CARE_PROVIDER_SITE_OTHER): Payer: Medicare (Managed Care) | Admitting: Vascular Surgery

## 2010-07-13 DIAGNOSIS — I6529 Occlusion and stenosis of unspecified carotid artery: Secondary | ICD-10-CM

## 2010-07-14 NOTE — Assessment & Plan Note (Signed)
OFFICE VISIT  [name] [name], [name] [name] DOB:  Nov 05, 1924                                       07/13/2010 ZOXWR#:[id number]  The patient returns for initial follow-up regarding his left carotid endarterectomy I performed on May 30 for severe left internal carotid stenosis.  He had suffered a left brain stroke several months preoperatively which left him with a right hemiparesis.  He has done well from the standpoint of his left carotid surgery with no new neurologic deficits or complications.  He is swallowing well, has no hoarseness and is back to his baseline state.  He did developed a urinary tract infection postoperatively and continues to have a Foley catheter in place and will be seeing the urologist later today for possible upcoming surgery.  He does take aspirin and Coumadin.  PHYSICAL EXAMINATION:  Today, blood pressure 157/77, heart rate 62, respirations 18, temperature 98.  His neurologic status is the same as it was preoperatively with the right hemiparesis.  Left neck incision has healed nicely.  He has 3+ carotid pulse and no bruits.  In general I think he is doing well.  We will see him in 6 months with a follow-up carotid duplex exam unless he develops any neurologic symptoms in the interim.    [name], M.D. Electronically Signed  JDL/MEDQ  D:  07/13/2010  T:  07/14/2010  Job:  [id number]

## 2010-08-02 ENCOUNTER — Encounter: Payer: Self-pay | Admitting: Internal Medicine

## 2010-08-10 ENCOUNTER — Encounter: Payer: Self-pay | Admitting: Cardiology

## 2010-08-10 ENCOUNTER — Ambulatory Visit: Payer: Medicare (Managed Care) | Admitting: Cardiology

## 2010-08-10 ENCOUNTER — Ambulatory Visit (INDEPENDENT_AMBULATORY_CARE_PROVIDER_SITE_OTHER): Payer: Medicare (Managed Care) | Admitting: Cardiology

## 2010-08-10 DIAGNOSIS — I4891 Unspecified atrial fibrillation: Secondary | ICD-10-CM

## 2010-08-10 DIAGNOSIS — I10 Essential (primary) hypertension: Secondary | ICD-10-CM

## 2010-08-10 DIAGNOSIS — I6529 Occlusion and stenosis of unspecified carotid artery: Secondary | ICD-10-CM

## 2010-08-10 DIAGNOSIS — Z01818 Encounter for other preprocedural examination: Secondary | ICD-10-CM

## 2010-08-10 DIAGNOSIS — I2510 Atherosclerotic heart disease of native coronary artery without angina pectoris: Secondary | ICD-10-CM

## 2010-08-10 NOTE — Assessment & Plan Note (Signed)
Would resume Coumadin postoperatively.

## 2010-08-10 NOTE — Assessment & Plan Note (Signed)
Status post recent carotid endarterectomy by Dr. Hart Rochester.

## 2010-08-10 NOTE — Assessment & Plan Note (Signed)
No active angina. Continue observation. 

## 2010-08-10 NOTE — Assessment & Plan Note (Signed)
Blood pressure trend is better.

## 2010-08-10 NOTE — Progress Notes (Signed)
Clinical Summary [name] [name] is an 77 y.o.male presenting for followup. He was seen back in April.  He is status post left carotid endarterectomy with Dr. Hart Rochester back in May. Did relatively well with surgery.  He has had subsequent difficulty with urinary retention and is pending prostate surgery with Dr. Baldo Ash on Monday. He is temporarily off of his Coumadin.  He denies any chest pain or palpitations. Still has right-sided weakness and expressive aphasia. Uses a cane to ambulate. He is reported to have some increased unsteadiness in his gait and perhaps swelling of his right eye recently, although "overdid it" out of doors earlier in the morning. He denies any headache.  Recent preoperative ECGs are reviewed showing paced rhythm with appropriate increase in the rate with magnet tracing.   Allergies  Allergen Reactions  . Clopidogrel Bisulfate     REACTION: rash    Current outpatient prescriptions:aspirin 81 MG tablet, Take 81 mg by mouth daily.  , Disp: , Rfl: ;  carvedilol (COREG) 6.25 MG tablet, Take 6.25 mg by mouth 2 (two) times daily with a meal.  , Disp: , Rfl: ;  co-enzyme Q-10 30 MG capsule, Takes one tab 3 x week , Disp: , Rfl: ;  docusate sodium (COLACE) 100 MG capsule, As needed.   , Disp: , Rfl:  insulin aspart (NOVOLOG) 100 UNIT/ML injection, Inject 5 Units into the skin 3 (three) times daily before meals.  , Disp: , Rfl: ;  insulin glargine (LANTUS) 100 UNIT/ML injection, Inject 14 Units into the skin at bedtime.  , Disp: , Rfl: ;  mineral oil liquid, As needed.   , Disp: , Rfl: ;  Omega-3 Fatty Acids (FISH OIL) 1000 MG CAPS, Take by mouth daily.  , Disp: , Rfl:  simvastatin (ZOCOR) 80 MG tablet, Take 1/2 tab (40mg ) every evening, Disp: , Rfl: ;  amLODipine (NORVASC) 10 MG tablet, Take 10 mg by mouth daily.  , Disp: , Rfl: ;  Ascorbic Acid (VITAMIN C) 1000 MG tablet, Takes one tab 3 x week , Disp: , Rfl: ;  glipiZIDE (GLUCOTROL) 5 MG tablet, Take 5 mg by mouth daily.  , Disp: , Rfl: ;   lisinopril (PRINIVIL,ZESTRIL) 20 MG tablet, Take 20 mg by mouth daily.  , Disp: , Rfl:  NIFEdipine (PROCARDIA-XL) 30 MG (OSM) 24 hr tablet, Take 30 mg by mouth daily.  , Disp: , Rfl: ;  nitroGLYCERIN (NITROSTAT) 0.4 MG SL tablet, Place 0.4 mg under the tongue every 5 (five) minutes as needed. Do this for up to 3 doses for chest pain. If pain continues, go to ED. , Disp: , Rfl: ;  oxybutynin (DITROPAN) 5 MG tablet, Take 5 mg by mouth 2 (two) times daily.  , Disp: , Rfl:  VITAMIN D, CHOLECALCIFEROL, PO, Takes one tab 3 x wee , Disp: , Rfl: ;  vitamin E 400 UNIT capsule, Takes one tab 3 x week , Disp: , Rfl: ;  warfarin (COUMADIN) 1 MG tablet, Take 1 mg by mouth as directed.  , Disp: , Rfl: ;  warfarin (COUMADIN) 5 MG tablet, Take 5 mg by mouth daily. Managed by Dr. Margo Common. , Disp: , Rfl:   Past Medical History  Diagnosis Date  . Coronary atherosclerosis of native coronary artery     Multivessel, LVEF 60%  . Essential hypertension, benign   . Myocardial infarction     NSTEMI  . Atrial fibrillation     Refused Coumadin until recent stroke  . Hearing loss   .  Type 2 diabetes mellitus   . Sick sinus syndrome   . Stroke     January 2012, left frontoparietal infarct  . Carotid artery disease     Dr. Hart Rochester    Past Surgical History  Procedure Date  . Hernia repair   . Appendectomy   . Coronary artery bypass graft 2001    [place] - LIMA to LAD, SVG to OM, SVG to PDA  . Cholecystectomy   . Pacemaker placement 2011    [employer] RF SR model [id number]  . Carotid endarterectomy 5/12    Dr. Hart Rochester    Family History  Problem Relation Age of Onset  . Coronary artery disease      Social History [name] [name] reports that he has never smoked. He has never used smokeless tobacco. [name] [name] reports that he does not drink alcohol.  Review of Systems Otherwise negative except as outlined above.  Physical Examination Filed Vitals:   08/10/10 1057  BP: 134/76  Pulse: 72  Resp: 18    Normally nourished appearing elderly male in no acute distress.  HEENT: Conjunctiva and lids normal, oropharynx clear.  Neck: Supple, no elevated JVP, soft left carotid bruit.  Lungs: Clear to auscultation, nonlabored.  Cardiac: Regular rate and rhythm with occasional ectopic beat, no S3 gallop or rub.  Abdomen: Soft, nontender, bowel sounds present.  Skin: Warm and dry.  Musculoskeletal: No gross deformities.  Extremities: 1+ edema noted around the ankles, also right hand.  Neuropsychiatric: Alert and oriented x3, expressive aphasia, right-sided residual weakness.   ECG Ventricular paced rhythm at 60 beats per minute.  Studies Echocardiogram 02/05/2010:  - Left ventricle: The cavity size was normal. Wall thickness was  increased in a pattern of moderate LVH. Systolic function was  normal. The estimated ejection fraction was in the range of 55% to  60%.  - Aortic valve: Mild regurgitation.  - Mitral valve: Mild regurgitation.  - Left atrium: The atrium was moderately dilated.  - Right atrium: The atrium was moderately dilated.  - Atrial septum: No defect or patent foramen ovale was identified.   Problem List and Plan

## 2010-08-10 NOTE — Assessment & Plan Note (Signed)
Patient now pending elective prostate surgery, reportedly not under general anesthesia. No new angina or progressive shortness of breath. Stable paced rhythm by ECG. He is temporarily off Coumadin. Would not anticipate further cardiac testing prior to proceeding. We can see him in consultation if necessary.

## 2010-08-10 NOTE — Patient Instructions (Signed)
Follow up as scheduled. Your physician recommends that you continue on your current medications as directed. Please refer to the Current Medication list given to you today. 

## 2010-09-09 ENCOUNTER — Encounter: Payer: Self-pay | Admitting: Internal Medicine

## 2010-09-16 ENCOUNTER — Encounter: Payer: Self-pay | Admitting: Cardiology

## 2010-09-17 ENCOUNTER — Encounter: Payer: Self-pay | Admitting: Cardiology

## 2010-09-17 ENCOUNTER — Ambulatory Visit (INDEPENDENT_AMBULATORY_CARE_PROVIDER_SITE_OTHER): Payer: Medicare (Managed Care) | Admitting: Cardiology

## 2010-09-17 VITALS — BP 154/81 | HR 61 | Ht 66.0 in | Wt 159.0 lb

## 2010-09-17 DIAGNOSIS — Z79899 Other long term (current) drug therapy: Secondary | ICD-10-CM

## 2010-09-17 DIAGNOSIS — I2510 Atherosclerotic heart disease of native coronary artery without angina pectoris: Secondary | ICD-10-CM

## 2010-09-17 DIAGNOSIS — I6529 Occlusion and stenosis of unspecified carotid artery: Secondary | ICD-10-CM

## 2010-09-17 DIAGNOSIS — I4891 Unspecified atrial fibrillation: Secondary | ICD-10-CM

## 2010-09-17 DIAGNOSIS — I10 Essential (primary) hypertension: Secondary | ICD-10-CM

## 2010-09-17 DIAGNOSIS — I495 Sick sinus syndrome: Secondary | ICD-10-CM

## 2010-09-17 DIAGNOSIS — R609 Edema, unspecified: Secondary | ICD-10-CM

## 2010-09-17 MED ORDER — FUROSEMIDE 20 MG PO TABS: 20.0000 mg | ORAL_TABLET | Freq: Every day | ORAL | Status: DC

## 2010-09-17 NOTE — Assessment & Plan Note (Signed)
Continue medical therapy, sodium restriction. 

## 2010-09-17 NOTE — Patient Instructions (Signed)
Follow up as scheduled. Start Lasix (furosemide) 20 mg daily. Your physician recommends that you go to the [facility] for lab work: [employer]. DO IN ABOUT 2 WEEKS (AROUND 9/14). If the results of your test are normal or stable, you will receive a letter. If they are abnormal, the nurse will contact you by phone.

## 2010-09-17 NOTE — Assessment & Plan Note (Signed)
Followed by Dr. Lawson. 

## 2010-09-17 NOTE — Assessment & Plan Note (Signed)
Seems to be somewhat progressive by exam. We will initiate trial of low-dose Lasix for symptomatic benefit. Followup BMET over the next few weeks. May need a potassium supplement.

## 2010-09-17 NOTE — Progress Notes (Signed)
Clinical Summary [name] [name] is a 77 y.o.male presenting for followup. He was seen in July prior to elective prostate surgery, temporarily off Coumadin.  He underwent photo selective vaporization of the prostate under spinal anesthesia and sedation. Records indicate that he did have a recurrent stroke off Coumadin postoperatively. He was admitted to the hospitalist service at [facility], underwent physical therapy.  He continues on Coumadin now, ambulates with a cane. No complaints of palpitations or chest pain. He has chronic edema of the lower extremities below the knees, right greater than left. This does seem to bother him, and is uncomfortable. Not particularly better when he raises his legs. It has been notable since his original stroke, perhaps some component of sympathetic dysfunction. LV function has been normal.   Allergies  Allergen Reactions  . Clopidogrel Bisulfate     REACTION: rash    Medication list reviewed.  Past Medical History  Diagnosis Date  . Coronary atherosclerosis of native coronary artery     Multivessel, LVEF 60%  . Essential hypertension, benign   . Myocardial infarction     NSTEMI  . Atrial fibrillation     Refused Coumadin until recent stroke  . Hearing loss   . Type 2 diabetes mellitus   . Sick sinus syndrome   . Stroke     January 2012, left frontoparietal infarct  . Carotid artery disease     Dr. Hart Rochester    Past Surgical History  Procedure Date  . Hernia repair   . Appendectomy   . Coronary artery bypass graft 2001    [place] - LIMA to LAD, SVG to OM, SVG to PDA  . Cholecystectomy   . Pacemaker placement 2011    [employer] RF SR model [id number]  . Carotid endarterectomy 5/12    Dr. Hart Rochester    Family History  Problem Relation Age of Onset  . Coronary artery disease      Social History Mr. Demery reports that he has never smoked. He has never used smokeless tobacco. Mr. Fahrner reports that he does not drink alcohol.  Review of  Systems As outlined above. No reported bleeding problems.  Physical Examination Filed Vitals:   09/17/10 0925  BP: 154/81  Pulse: 61   Normally nourished appearing elderly male in no acute distress.  HEENT: Conjunctiva and lids normal, oropharynx clear.  Neck: Supple, no elevated JVP, soft left carotid bruit.  Lungs: Clear to auscultation, nonlabored.  Cardiac: Regular rate and rhythm with occasional ectopic beat, no S3 gallop or rub.  Thorax: Healing ecchymosis left chest after hitting it on dresser. Abdomen: Soft, nontender, bowel sounds present.  Skin: Warm and dry.  Musculoskeletal: No gross deformities.  Extremities: 1 to 2+ edema noted around the ankles, also right hand. Generally worse on the right Neuropsychiatric: Alert and oriented x3, expressive aphasia, right-sided residual weakness.   ECG Reviewed in EMR.   Problem List and Plan

## 2010-09-17 NOTE — Assessment & Plan Note (Signed)
Status post pacemaker placement, followed by Dr. Allred. 

## 2010-09-17 NOTE — Assessment & Plan Note (Signed)
Persistent, plan to continue long-term Coumadin, rate control seems adequate with generally paced rhythm at baseline.

## 2010-09-17 NOTE — Assessment & Plan Note (Addendum)
No active angina on medical therapy. Recent troponin levels normal.

## 2010-10-29 ENCOUNTER — Encounter: Payer: Medicare (Managed Care) | Admitting: Internal Medicine

## 2010-11-04 ENCOUNTER — Encounter: Payer: Self-pay | Admitting: *Deleted

## 2010-12-03 ENCOUNTER — Encounter: Payer: Self-pay | Admitting: Cardiology

## 2010-12-17 ENCOUNTER — Ambulatory Visit: Payer: Medicare (Managed Care) | Admitting: Cardiology

## 2010-12-28 ENCOUNTER — Encounter: Payer: Self-pay | Admitting: Vascular Surgery

## 2010-12-31 ENCOUNTER — Encounter: Payer: Self-pay | Admitting: *Deleted

## 2010-12-31 NOTE — Progress Notes (Signed)
Patient ID: [name] [name], male   DOB: [birth year], 77 y.o.   MRN: [id number]  Certified letter mailed to pt regarding lab work that has not been completed.

## 2011-01-03 ENCOUNTER — Encounter: Payer: Self-pay | Admitting: Vascular Surgery

## 2011-01-04 ENCOUNTER — Other Ambulatory Visit: Payer: Medicare (Managed Care)

## 2011-01-04 ENCOUNTER — Ambulatory Visit: Payer: Medicare (Managed Care) | Admitting: Vascular Surgery

## 2011-01-21 NOTE — Progress Notes (Signed)
Patient ID: [name] [name], male   DOB: 09-17-1924, 78 y.o.   MRN: [id number] Certified letter returned w/a corrected address of [address] in [place] [address]. Letter will be remailed to this address.

## 2011-02-21 ENCOUNTER — Encounter: Payer: Self-pay | Admitting: Vascular Surgery

## 2011-02-22 ENCOUNTER — Encounter: Payer: Self-pay | Admitting: Vascular Surgery

## 2011-02-22 ENCOUNTER — Ambulatory Visit (INDEPENDENT_AMBULATORY_CARE_PROVIDER_SITE_OTHER): Payer: Medicare PPO | Admitting: Vascular Surgery

## 2011-02-22 ENCOUNTER — Other Ambulatory Visit (INDEPENDENT_AMBULATORY_CARE_PROVIDER_SITE_OTHER): Payer: Medicare PPO | Admitting: *Deleted

## 2011-02-22 VITALS — BP 146/81 | HR 60 | Resp 16 | Ht 64.0 in | Wt 166.0 lb

## 2011-02-22 DIAGNOSIS — Z48812 Encounter for surgical aftercare following surgery on the circulatory system: Secondary | ICD-10-CM

## 2011-02-22 DIAGNOSIS — I6529 Occlusion and stenosis of unspecified carotid artery: Secondary | ICD-10-CM

## 2011-02-22 DIAGNOSIS — I63239 Cerebral infarction due to unspecified occlusion or stenosis of unspecified carotid arteries: Secondary | ICD-10-CM

## 2011-02-22 NOTE — Progress Notes (Signed)
Subjective:     Patient ID: [name] [name], male   DOB: 1924-09-03, 78 y.o.   MRN: [id number]  HPI this 78 year old male patient returns for continued followup regarding his left carotid endarterectomy which was performed in May of 2012 for severe left internal carotid stenosis. He had previously suffered a left brain stroke in January of 2012. He has been receiving physical therapy since that time. He has continued to have aphasia. He is able to ambulate with a walker. He also had some prostate surgery performed last year. He denies any new neurologic symptoms involving his contralateral left side such as lateralizing weakness, syncope, or amaurosis fugax  Past Medical History  Diagnosis Date  . Coronary atherosclerosis of native coronary artery     Multivessel, LVEF 60%  . Essential hypertension, benign   . Myocardial infarction     NSTEMI  . Atrial fibrillation     Refused Coumadin until recent stroke  . Hearing loss   . Type 2 diabetes mellitus   . Sick sinus syndrome   . Stroke     January 2012, left frontoparietal infarct  . Carotid artery disease     Dr. Hart Rochester  . Diabetes mellitus   . CHF (congestive heart failure)   . Arthritis     History  Substance Use Topics  . Smoking status: Never Smoker   . Smokeless tobacco: Never Used  . Alcohol Use: No    Family History  Problem Relation Age of Onset  . Coronary artery disease      Allergies  Allergen Reactions  . Clopidogrel Bisulfate     REACTION: rash    Current outpatient prescriptions:amLODipine (NORVASC) 10 MG tablet, Take 10 mg by mouth daily.  , Disp: , Rfl: ;  Ascorbic Acid (VITAMIN C) 1000 MG tablet, Takes one tab 3 x week , Disp: , Rfl: ;  aspirin 81 MG tablet, Take 81 mg by mouth daily.  , Disp: , Rfl: ;  carvedilol (COREG) 6.25 MG tablet, Take 6.25 mg by mouth 2 (two) times daily with a meal.  , Disp: , Rfl: ;  co-enzyme Q-10 30 MG capsule, Takes one tab 3 x week , Disp: , Rfl:  insulin aspart (NOVOLOG) 100  UNIT/ML injection, Inject 5 Units into the skin 3 (three) times daily before meals.  , Disp: , Rfl: ;  insulin glargine (LANTUS) 100 UNIT/ML injection, Inject 14 Units into the skin at bedtime.  , Disp: , Rfl: ;  mineral oil liquid, As needed.   , Disp: , Rfl:  nitroGLYCERIN (NITROSTAT) 0.4 MG SL tablet, Place 0.4 mg under the tongue every 5 (five) minutes as needed. Do this for up to 3 doses for chest pain. If pain continues, go to ED. , Disp: , Rfl: ;  Omega-3 Fatty Acids (FISH OIL) 1000 MG CAPS, Take by mouth daily.  , Disp: , Rfl: ;  simvastatin (ZOCOR) 80 MG tablet, Take 1/2 tab (40mg ) every evening, Disp: , Rfl: ;  VITAMIN D, CHOLECALCIFEROL, PO, Takes one tab 3 x wee , Disp: , Rfl:  vitamin E 400 UNIT capsule, Takes one tab 3 x week , Disp: , Rfl: ;  warfarin (COUMADIN) 5 MG tablet, Take 5 mg by mouth daily. Managed by Dr. Margo Common. , Disp: , Rfl: ;  docusate sodium (COLACE) 100 MG capsule, As needed.   , Disp: , Rfl: ;  furosemide (LASIX) 20 MG tablet, Take 1 tablet (20 mg total) by mouth daily., Disp: 30 tablet, Rfl: 6;  warfarin (COUMADIN) 1 MG tablet, Take 1 mg by mouth as directed. , Disp: , Rfl:   BP 146/81  Pulse 60  Resp 16  Ht 5\' 4"  (1.626 m)  Wt 166 lb (75.297 kg)  BMI 28.49 kg/m2  SpO2 96%  Body mass index is 28.49 kg/(m^2).         Review of Systems denies chest pain, dyspnea on exertion, PND, orthopnea. Does have swelling in right lower extremity. Continues to have right-sided weakness and aphasia from previous stroke. Urinary symptoms better since prostate surgery    Objective:   Physical Exam blood pressure 146/81 heart rate 60 respirations 16 Gen. chronically ill-appearing elderly male with obvious right-sided hemiparesis and facial asymmetry Lungs no rhonchi or wheezing Cardiovascular regular rhythm no murmurs carotid pulses 3+ no audible bruits Abdomen soft nontender with no masses Lower extremity 1+ edema on the right no edema on the left Neurologic right  hemiparesis with aphasia  Today I ordered a carotid duplex exam which I reviewed and interpreted. There is no evidence of restenosis of the left carotid endarterectomy site. Right internal carotid continues to be widely patent.    Assessment:     Stable post left carotid endarterectomy which was performed following left brain stroke    Plan:     Continue physical therapy as outpatient Return in 6 months for carotid duplex exam to monitor surgical side on the left and contralateral right side

## 2011-02-23 NOTE — Progress Notes (Signed)
Addended by: Sharee Pimple on: 02/23/2011 08:50 AM   Modules accepted: Orders

## 2011-02-23 NOTE — Progress Notes (Signed)
Patient ID: [name] [name], male   DOB: 1924-11-27, 78 y.o.   MRN: [id number]  Certified letter returned "No such Number Unable to forward."

## 2011-03-01 NOTE — Procedures (Unsigned)
CAROTID DUPLEX EXAM  INDICATION:  Followup left CEA 06/06/2010  HISTORY: Diabetes:  Yes Cardiac:  Yes Hypertension:  Yes Smoking:  No Previous Surgery:  Left CEA CV History:  CVA 01/2010 Amaurosis Fugax No, Paresthesias No, Hemiparesis Yes                                      RIGHT             LEFT Brachial systolic pressure:         158               158 Brachial Doppler waveforms:         WNL               WNL Vertebral direction of flow:        Antegrade         Antegrade DUPLEX VELOCITIES (cm/sec) CCA peak systolic                   67                86 ECA peak systolic                   116               289 ICA peak systolic                   54                32 ICA end diastolic                   17                9 PLAQUE MORPHOLOGY:                  Heterogeneous     Heterogeneous PLAQUE AMOUNT:                      Mild              Moderate PLAQUE LOCATION:                    ICA               ECA  IMPRESSION: 1. 1%-39% right internal carotid artery stenosis. 2. Widely patent left carotid endarterectomy without evidence of     restenosis or hyperplasia. 3. Significant stenosis is observed of the left external carotid     artery. 4. Bilateral vertebral arteries are within normal limits. 5. Incidental finding:  There are nonvascularized nodes in the left     thyroid measuring approximately 0.72 cm x 0.75 cm.  ___________________________________________ Quita Skye. Hart Rochester, M.D.  LT/MEDQ  D:  02/22/2011  T:  02/22/2011  Job:  [id number]

## 2011-04-06 ENCOUNTER — Encounter: Payer: Medicare (Managed Care) | Admitting: Internal Medicine

## 2011-08-18 HISTORY — PX: JOINT REPLACEMENT: SHX530

## 2011-08-19 ENCOUNTER — Encounter: Payer: Self-pay | Admitting: Neurosurgery

## 2011-08-22 ENCOUNTER — Other Ambulatory Visit: Payer: Medicare PPO

## 2011-08-22 ENCOUNTER — Ambulatory Visit: Payer: Medicare PPO | Admitting: Neurosurgery

## 2011-09-02 IMAGING — CT CT HEAD W/O CM
1 of 2 series · 13 of 30 positions shown, 17 images · non-contrast
Comparison: 02/05/2010

CLINICAL DATA: Code stroke.  Woke 8-100 unable to move extremities
and disoriented.

CT HEAD WITHOUT CONTRAST
TECHNIQUE: Contiguous axial images were obtained from the base of
the skull through the vertex without contrast.

[Series 2: brain · axial · 0.47mm/px · z∈[+64,+196]mm · 13 of 32 slices shown, 17 images]
[im 3/32  brain]
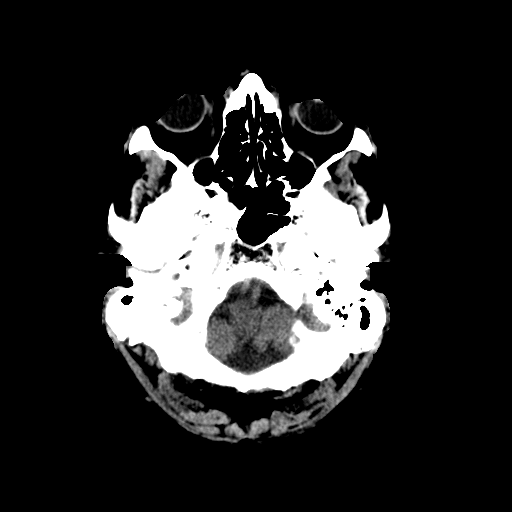
[im 3/32  bone]
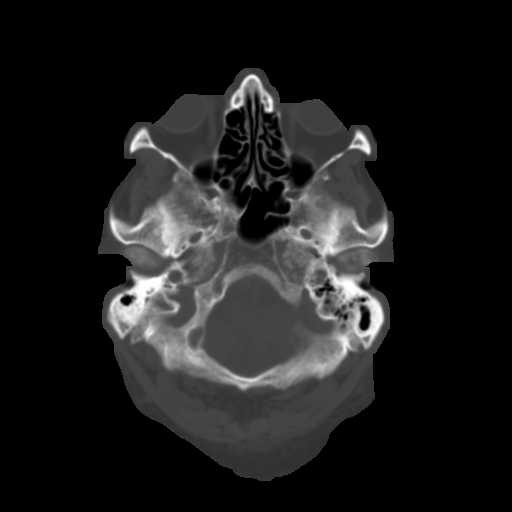
[im 5/32  brain]
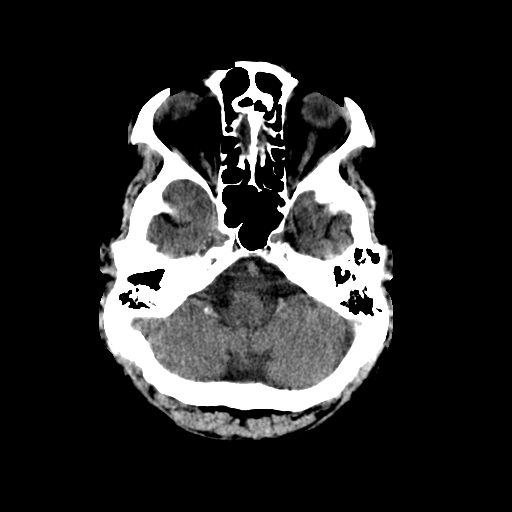
[im 7/32  brain]
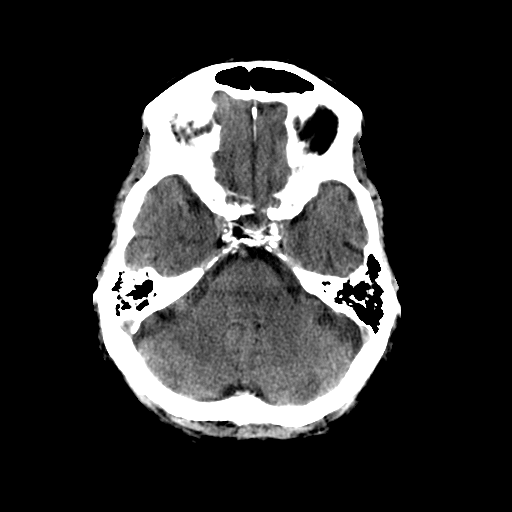
[im 9/32  brain]
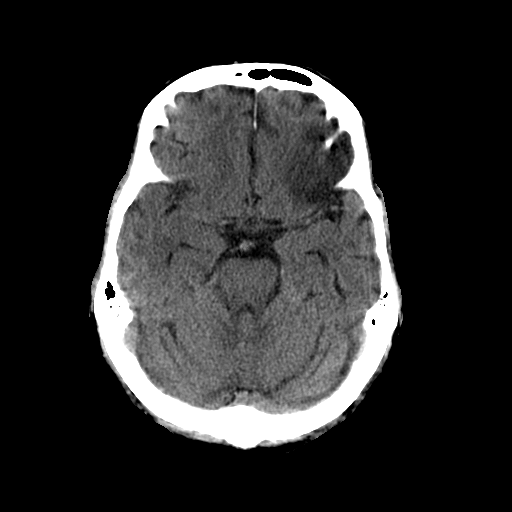
[im 12/32  brain]
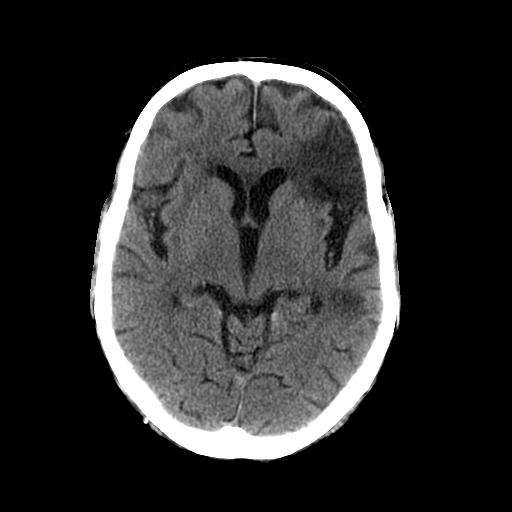
[im 12/32  bone]
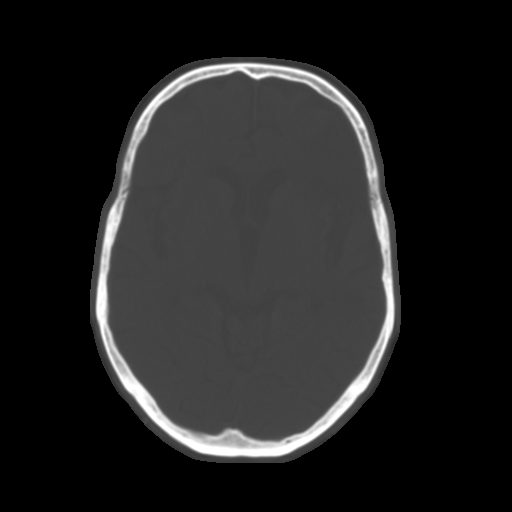
[im 14/32  brain]
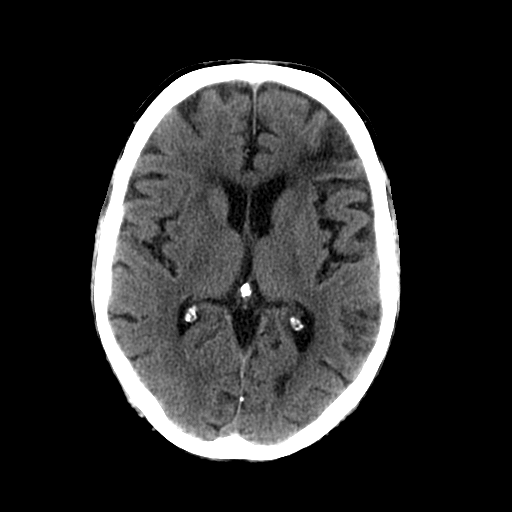
[im 16/32  brain]
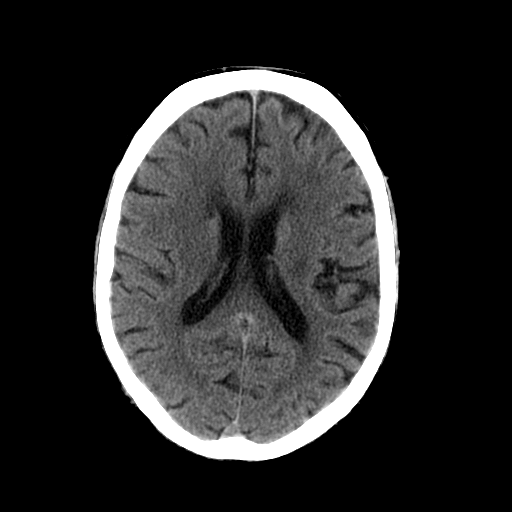
[im 18/32  brain]
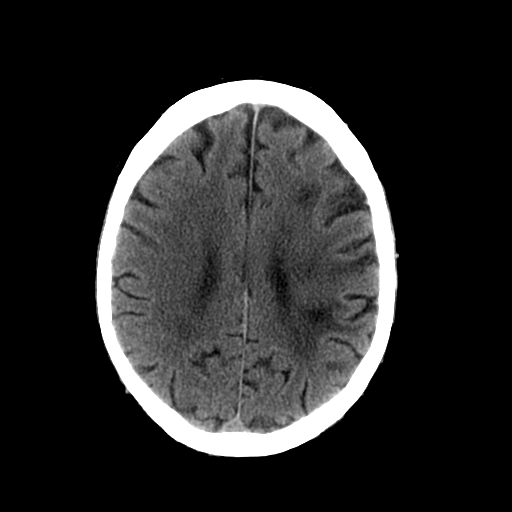
[im 20/32  brain]
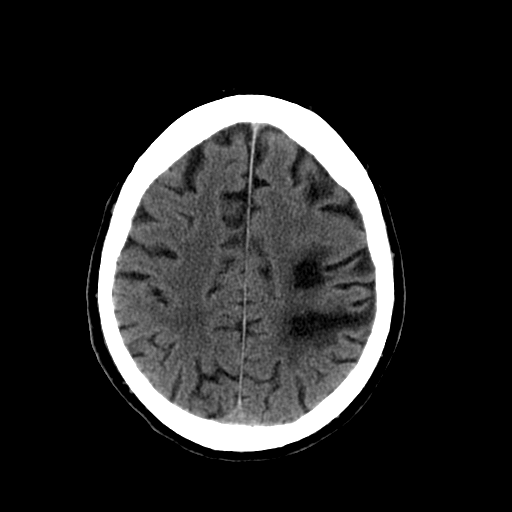
[im 20/32  bone]
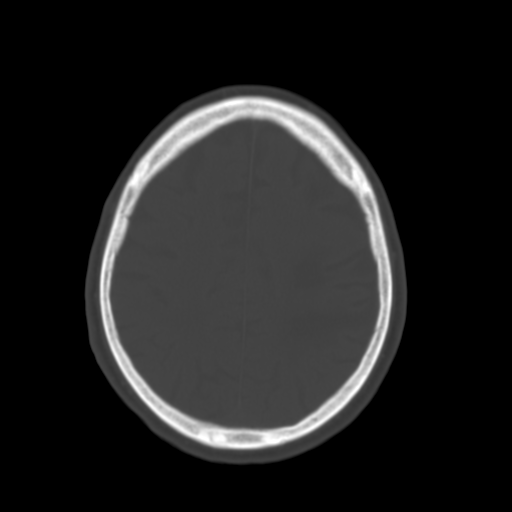
[im 23/32  brain]
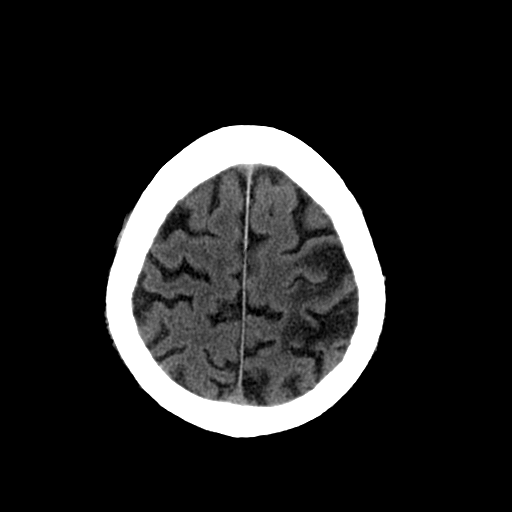
[im 25/32  brain]
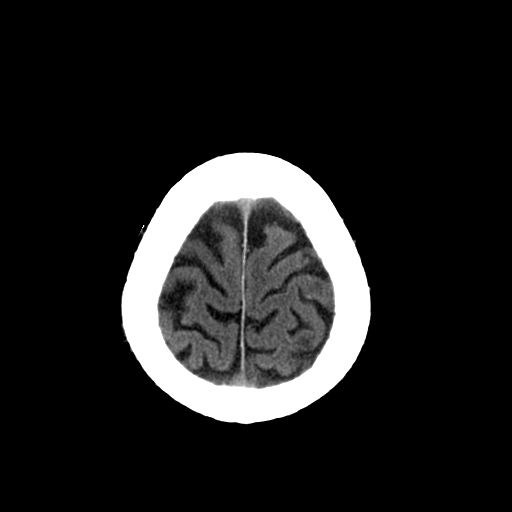
[im 27/32  brain]
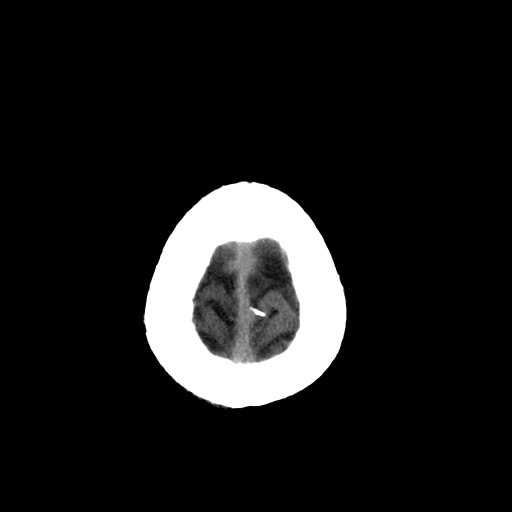
[im 29/32  brain]
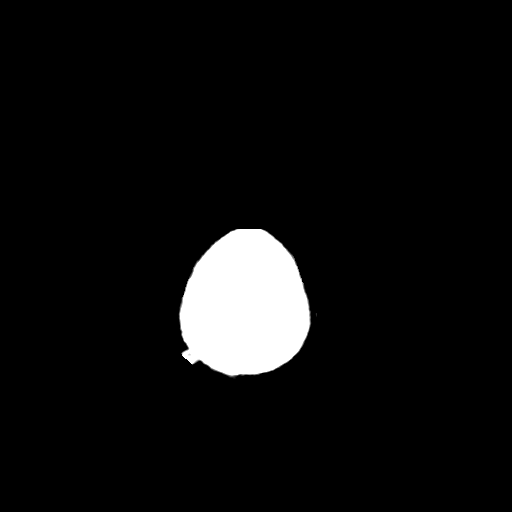
[im 29/32  bone]
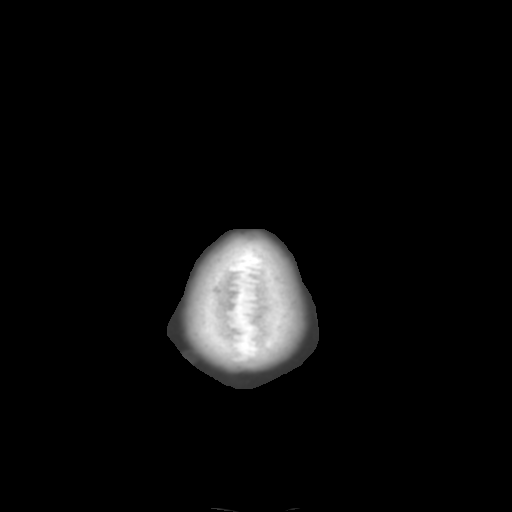

[13 of 30 positions shown; findings below may reference images not displayed]

FINDINGS: Old left frontal, temporal, and parietal infarcts.  Old
left cerebellar infarcts.  No evidence of acute intracranial
hemorrhage.  No evidence of acute edema or sulci effacement.  No
mass effect or midline shift.  No abnormal extra-axial fluid
collections.  Diffuse atrophic changes.  Low attenuation change in
the deep white matter consistent with small vessel ischemia.
Vascular calcifications in the internal carotid arteries.  No
depressed skull fractures.  Visualized paranasal sinuses are not
opacified.
IMPRESSION: No evidence of acute intracranial hemorrhage, mass lesion, or acute
infarct.  Multiple old left cerebral hemisphere infarcts as seen
previously.

Critical test results telephoned to Dr. Augustu at the time of
interpretation on 05/27/2010 at 3576 hours.

## 2011-09-17 IMAGING — CR DG CHEST 2V
2 series · 2 of 2 positions shown · non-contrast
Comparison: PA and lateral chest 05/02/2009.

CLINICAL DATA: Preoperative respiratory film.  Patient for CABG.

CHEST - 2 VIEW

[view not recorded (1 of 2)]
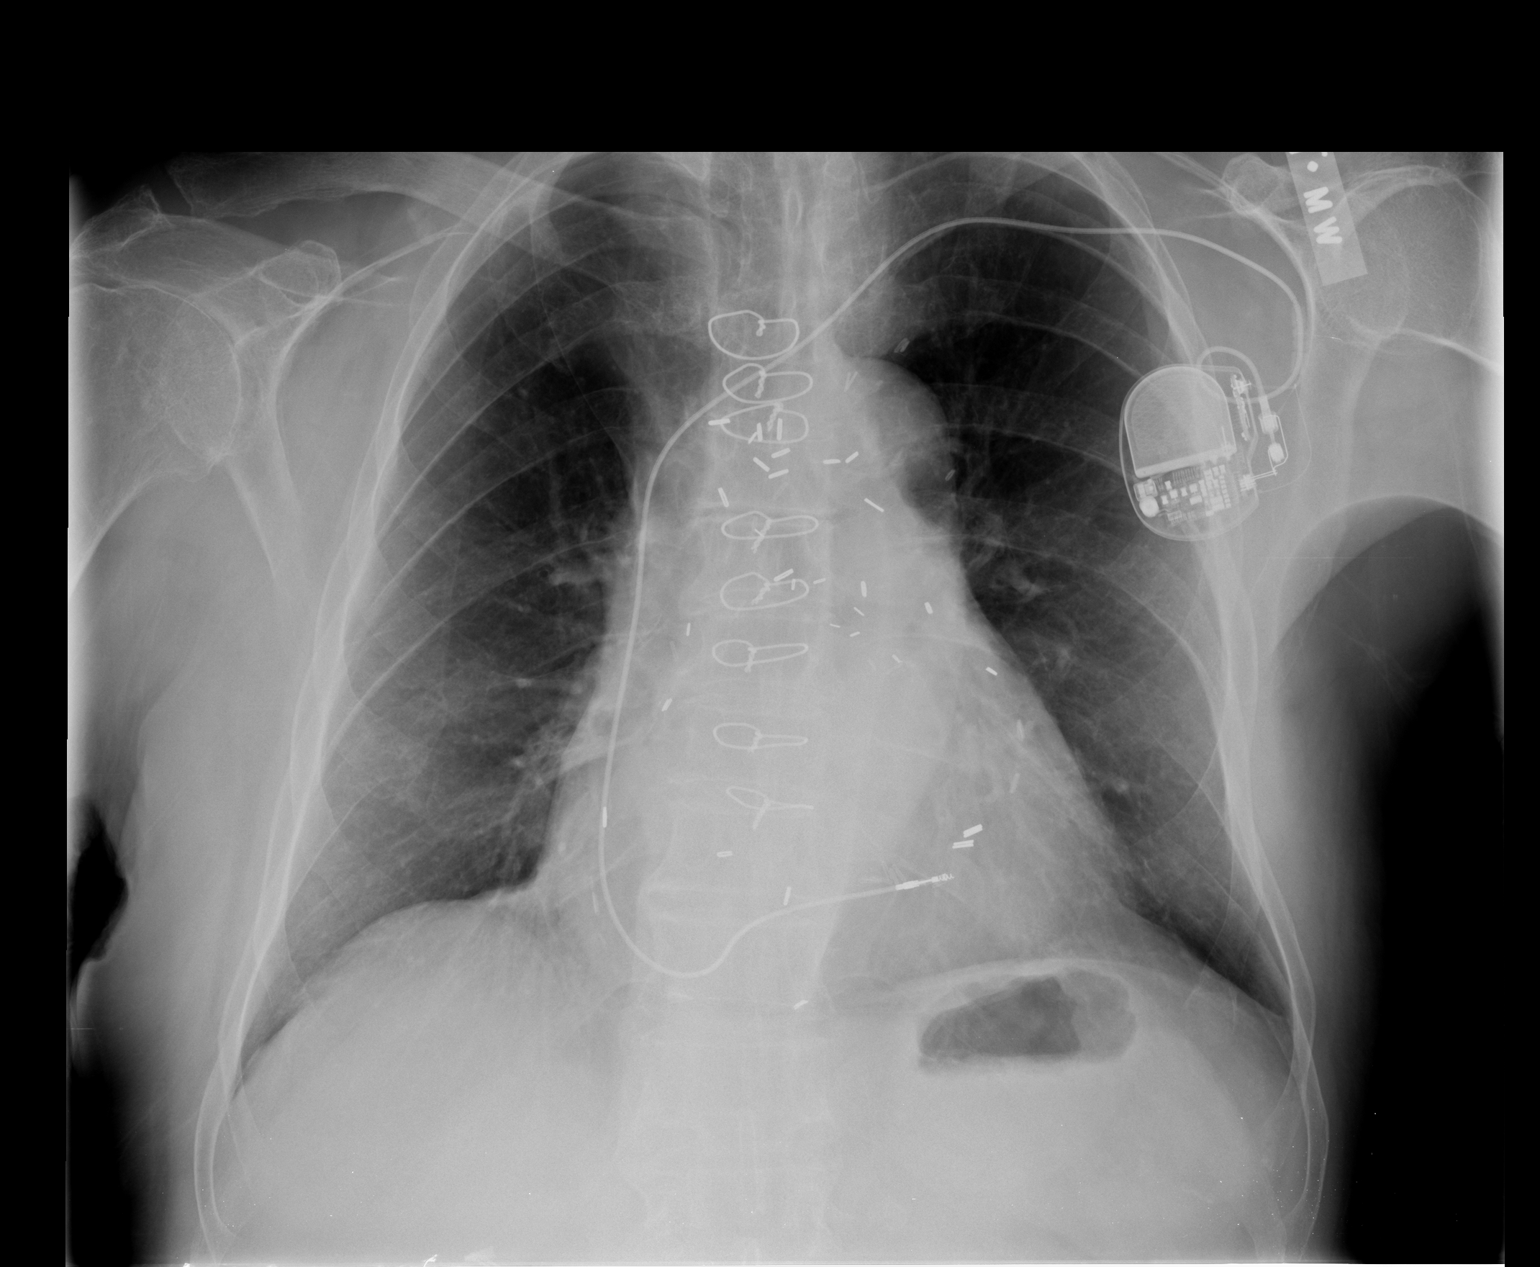

[view not recorded (2 of 2)]
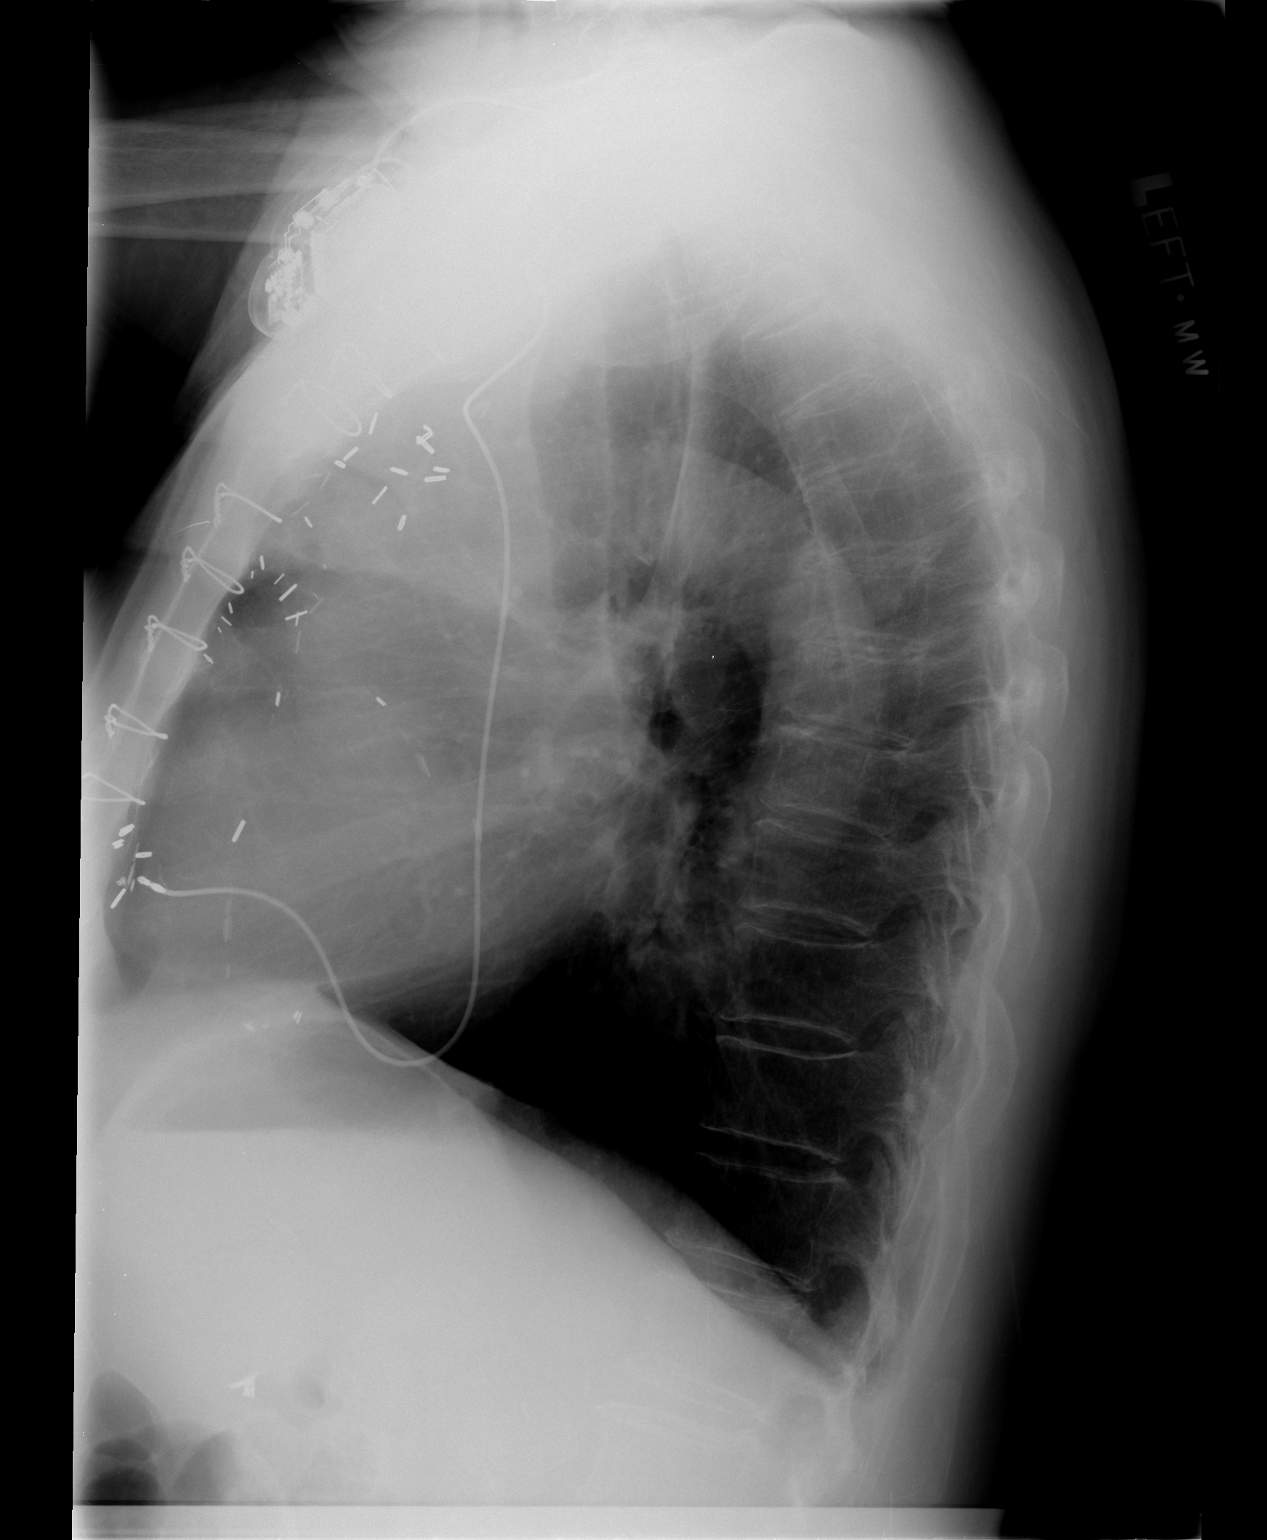

[2 of 2 positions shown; findings below may reference images not displayed]

FINDINGS: There is mild cardiomegaly but no pulmonary edema.  The
patient is status post CABG with a single lead pacing device in
place.  No pneumothorax or pleural effusion.
IMPRESSION: No acute disease.

## 2012-01-06 ENCOUNTER — Encounter: Payer: Self-pay | Admitting: Neurosurgery

## 2012-01-09 ENCOUNTER — Other Ambulatory Visit (INDEPENDENT_AMBULATORY_CARE_PROVIDER_SITE_OTHER): Payer: Medicare PPO | Admitting: *Deleted

## 2012-01-09 ENCOUNTER — Ambulatory Visit (INDEPENDENT_AMBULATORY_CARE_PROVIDER_SITE_OTHER): Payer: Medicare PPO | Admitting: Neurosurgery

## 2012-01-09 ENCOUNTER — Encounter: Payer: Self-pay | Admitting: Neurosurgery

## 2012-01-09 VITALS — BP 133/70 | HR 60 | Resp 16 | Ht 64.0 in | Wt 170.0 lb

## 2012-01-09 DIAGNOSIS — I6529 Occlusion and stenosis of unspecified carotid artery: Secondary | ICD-10-CM

## 2012-01-09 DIAGNOSIS — R209 Unspecified disturbances of skin sensation: Secondary | ICD-10-CM

## 2012-01-09 DIAGNOSIS — R200 Anesthesia of skin: Secondary | ICD-10-CM | POA: Insufficient documentation

## 2012-01-09 DIAGNOSIS — M7989 Other specified soft tissue disorders: Secondary | ICD-10-CM | POA: Insufficient documentation

## 2012-01-09 DIAGNOSIS — Z48812 Encounter for surgical aftercare following surgery on the circulatory system: Secondary | ICD-10-CM

## 2012-01-09 DIAGNOSIS — I63239 Cerebral infarction due to unspecified occlusion or stenosis of unspecified carotid arteries: Secondary | ICD-10-CM

## 2012-01-09 NOTE — Addendum Note (Signed)
Addended by: Sharee Pimple on: 01/09/2012 03:23 PM   Modules accepted: Orders

## 2012-01-09 NOTE — Progress Notes (Signed)
[facility] Carotid Office Note  CC: Carotid surveillance Referring Physician: Hart Rochester  History of Present Illness: 78 year old male patient of Dr. Hart Rochester status post left CEA in may of 2012. The patient did have a left brain stroke in January 2012. The patient and his daughter report no other signs or symptoms of CVA, TIA, amaurosis fugax or neural deficit other than the residual from the stroke since that time. The patient has no new medical diagnoses or recent surgery.  Past Medical History  Diagnosis Date  . Coronary atherosclerosis of native coronary artery     Multivessel, LVEF 60%  . Essential hypertension, benign   . Myocardial infarction     NSTEMI  . Atrial fibrillation     Refused Coumadin until recent stroke  . Hearing loss   . Type 2 diabetes mellitus   . Sick sinus syndrome   . Stroke     January 2012, left frontoparietal infarct  . Carotid artery disease     Dr. Hart Rochester  . Diabetes mellitus   . CHF (congestive heart failure)   . Arthritis     ROS: [x]  Positive   [ ]  Denies    General: [ ]  Weight loss, [ ]  Fever, [ ]  chills Neurologic: [ ]  Dizziness, [ ]  Blackouts, [ ]  Seizure [ ]  Stroke, [ ]  "Mini stroke", [ ]  Slurred speech, [ ]  Temporary blindness; [ ]  weakness in arms or legs, [ ]  Hoarseness Cardiac: [ ]  Chest pain/pressure, [ ]  Shortness of breath at rest [ ]  Shortness of breath with exertion, [ ]  Atrial fibrillation or irregular heartbeat Vascular: [ ]  Pain in legs with walking, [ ]  Pain in legs at rest, [ ]  Pain in legs at night,  [ ]  Non-healing ulcer, [ ]  Blood clot in vein/DVT,   Pulmonary: [ ]  Home oxygen, [ ]  Productive cough, [ ]  Coughing up blood, [ ]  Asthma,  [ ]  Wheezing Musculoskeletal:  [ ]  Arthritis, [ ]  Low back pain, [ ]  Joint pain Hematologic: [ ]  Easy Bruising, [ ]  Anemia; [ ]  Hepatitis Gastrointestinal: [ ]  Blood in stool, [ ]  Gastroesophageal Reflux/heartburn, [ ]  Trouble swallowing Urinary: [ ]  chronic  Kidney disease, [ ]  on HD - [ ]  MWF or [ ]  TTHS, [ ]  Burning with urination, [ ]  Difficulty urinating Skin: [ ]  Rashes, [ ]  Wounds Psychological: [ ]  Anxiety, [ ]  Depression   Social History History  Substance Use Topics  . Smoking status: Never Smoker   . Smokeless tobacco: Never Used  . Alcohol Use: No    Family History Family History  Problem Relation Age of Onset  . Coronary artery disease      Allergies  Allergen Reactions  . Clopidogrel Bisulfate     REACTION: rash    Current Outpatient Prescriptions  Medication Sig Dispense Refill  . amLODipine (NORVASC) 10 MG tablet Take 10 mg by mouth daily.        . Ascorbic Acid (VITAMIN C) 1000 MG tablet Takes one tab 3 x week       . aspirin 81 MG tablet Take 81 mg by mouth daily.        . carvedilol (COREG) 6.25 MG tablet Take 6.25 mg by mouth 2 (two) times daily with a meal.        . co-enzyme Q-10 30 MG capsule Takes one tab 3 x week       . docusate sodium (COLACE) 100 MG capsule As needed.         Marland Kitchen  furosemide (LASIX) 20 MG tablet Take 1 tablet (20 mg total) by mouth daily.  30 tablet  6  . insulin aspart (NOVOLOG) 100 UNIT/ML injection Inject 5 Units into the skin 3 (three) times daily before meals.        . insulin glargine (LANTUS) 100 UNIT/ML injection Inject 14 Units into the skin at bedtime.        . mineral oil liquid As needed.         . nitroGLYCERIN (NITROSTAT) 0.4 MG SL tablet Place 0.4 mg under the tongue every 5 (five) minutes as needed. Do this for up to 3 doses for chest pain. If pain continues, go to ED.       Marland Kitchen Omega-3 Fatty Acids (FISH OIL) 1000 MG CAPS Take by mouth daily.        . simvastatin (ZOCOR) 80 MG tablet Take 1/2 tab (40mg ) every evening      . VITAMIN D, CHOLECALCIFEROL, PO Takes one tab 3 x wee       . vitamin E 400 UNIT capsule Takes one tab 3 x week       . warfarin (COUMADIN) 1 MG tablet Take 1 mg by mouth as directed.       . warfarin (COUMADIN) 5 MG tablet Take 5 mg by mouth daily.  Managed by Dr. Margo Common.         Physical Examination  There were no vitals filed for this visit.  There is no height or weight on file to calculate BMI.  General:  WDWN in NAD Gait: Normal HEENT: WNL Eyes: Pupils equal Pulmonary: normal non-labored breathing , without Rales, rhonchi,  wheezing Cardiac: RRR, without  Murmurs, rubs or gallops; Abdomen: soft, NT, no masses Skin: no rashes, ulcers noted  Vascular Exam Pulses: 2+ radial pulses bilaterally Carotid bruits: Carotid pulses to auscultation no bruits are heard Extremities without ischemic changes, no Gangrene , no cellulitis; no open wounds;  Musculoskeletal: no muscle wasting or atrophy   Neurologic: A&O X 3; Appropriate Affect ; SENSATION: normal; MOTOR FUNCTION:  moving all extremities equally. Speech is fluent/normal  Non-Invasive Vascular Imaging CAROTID DUPLEX 01/09/2012  Right ICA 20 - 39 % stenosis Left ICA 0 - 19% stenosis   ASSESSMENT/PLAN: Asymptomatic patient with no change and duplex from prior exam. The patient will followup in one year with repeat carotid duplex. The patient's questions were encouraged and answered, he is in agreement with this plan.  Lauree Chandler ANP   Clinic MD: Myra Gianotti

## 2012-03-07 ENCOUNTER — Encounter: Payer: Self-pay | Admitting: Internal Medicine

## 2012-03-07 ENCOUNTER — Ambulatory Visit (INDEPENDENT_AMBULATORY_CARE_PROVIDER_SITE_OTHER): Payer: Medicare PPO | Admitting: Internal Medicine

## 2012-03-07 VITALS — BP 149/79 | HR 65 | Ht 64.0 in | Wt 169.0 lb

## 2012-03-07 DIAGNOSIS — I4891 Unspecified atrial fibrillation: Secondary | ICD-10-CM

## 2012-03-07 LAB — PACEMAKER DEVICE OBSERVATION
BRDY-0002RV: 60 {beats}/min
BRDY-0005RV: 50 {beats}/min
DEVICE MODEL PM: 7089756
RV LEAD IMPEDENCE PM: 412.5 Ohm
RV LEAD THRESHOLD: 0.5 V

## 2012-03-07 NOTE — Patient Instructions (Addendum)
Continue all current medications. Allred - 1 year  

## 2012-03-11 NOTE — Progress Notes (Signed)
PCP: Louie Boston, MD Primary Cardiologist:  Andri Prestia is a 79 y.o. male who presents today for routine electrophysiology followup.  He has not been seen in the device clinic since implantation.  He is elderly and frail and does not travel much.  His hearing is poor.  Today, he denies symptoms of palpitations, chest pain, shortness of breath,  lower extremity edema, dizziness, presyncope, or syncope.  The patient is otherwise without complaint today.   Past Medical History  Diagnosis Date  . Coronary atherosclerosis of native coronary artery     Multivessel, LVEF 60%  . Essential hypertension, benign   . Myocardial infarction     NSTEMI  . Atrial fibrillation     Refused Coumadin until recent stroke  . Hearing loss   . Type 2 diabetes mellitus   . Sick sinus syndrome   . Carotid artery disease     Dr. Hart Rochester  . Diabetes mellitus   . CHF (congestive heart failure)   . Arthritis   . Stroke     January 2012, left frontoparietal infarct  . Stroke Aug. 2013    Mini stroke  . Chronic kidney disease Aug. 2013   Past Surgical History  Procedure Laterality Date  . Hernia repair    . Appendectomy    . Coronary artery bypass graft  2001    [place] - LIMA to LAD, SVG to OM, SVG to PDA  . Pacemaker placement  2011    [employer] RF SR model [id number]  . Carotid endarterectomy  5/12    Dr. Hart Rochester  . Cholecystectomy      Gall Bladder  . Joint replacement  Aug. 2013    Right Hip    Current Outpatient Prescriptions  Medication Sig Dispense Refill  . amLODipine (NORVASC) 10 MG tablet Take 10 mg by mouth daily.        . Ascorbic Acid (VITAMIN C) 1000 MG tablet Takes one tab 3 x week       . aspirin 81 MG tablet Take 81 mg by mouth daily.        . carvedilol (COREG) 6.25 MG tablet Take 6.25 mg by mouth 2 (two) times daily with a meal.        . co-enzyme Q-10 30 MG capsule Takes one tab 3 x week       . insulin aspart (NOVOLOG) 100 UNIT/ML injection Inject 8 Units into the  skin 3 (three) times daily before meals.       . insulin glargine (LANTUS) 100 UNIT/ML injection Inject 18 Units into the skin daily.       . mineral oil liquid As needed.         . nitroGLYCERIN (NITROSTAT) 0.4 MG SL tablet Place 0.4 mg under the tongue every 5 (five) minutes as needed. Do this for up to 3 doses for chest pain. If pain continues, go to ED.       Marland Kitchen Omega-3 Fatty Acids (FISH OIL) 1000 MG CAPS Take by mouth daily.        Marland Kitchen VITAMIN D, CHOLECALCIFEROL, PO Takes one tab 3 x wee       . vitamin E 400 UNIT capsule Takes one tab 3 x week       . warfarin (COUMADIN) 5 MG tablet Take 5 mg by mouth daily. Managed by Dr. Margo Common.        No current facility-administered medications for this visit.    Physical Exam: Filed Vitals:  03/07/12 1137  BP: 149/79  Pulse: 65  Height: 5\' 4"  (1.626 m)  Weight: 169 lb (76.658 kg)  SpO2: 98%    GEN- The patient is elderly appearing, alert and oriented x 3 today.   Head- normocephalic, atraumatic Eyes-  Sclera clear, conjunctiva pink Ears- hearing is very poor Oropharynx- clear Lungs- Clear to ausculation bilaterally, normal work of breathing Chest- pacemaker pocket is well healed Heart- Regular rate and rhythm, no murmurs, rubs or gallops, PMI not laterally displaced GI- soft, NT, ND, + BS Extremities- no clubbing, cyanosis,+  edema  Pacemaker interrogation- reviewed in detail today,  See PACEART report  Assessment and Plan:  1. Bradycardia Normal pacemaker function See Pace Art report No changes today  2. afib On coumadin

## 2012-04-16 ENCOUNTER — Ambulatory Visit: Payer: Medicare PPO | Admitting: Cardiology

## 2012-04-18 ENCOUNTER — Ambulatory Visit: Payer: Medicare PPO | Admitting: Cardiology

## 2012-05-14 ENCOUNTER — Encounter: Payer: Self-pay | Admitting: Cardiology

## 2012-05-14 ENCOUNTER — Ambulatory Visit (INDEPENDENT_AMBULATORY_CARE_PROVIDER_SITE_OTHER): Payer: Medicare PPO | Admitting: Cardiology

## 2012-05-14 VITALS — BP 127/69 | HR 60 | Ht 64.0 in | Wt 172.0 lb

## 2012-05-14 DIAGNOSIS — I10 Essential (primary) hypertension: Secondary | ICD-10-CM

## 2012-05-14 DIAGNOSIS — I2510 Atherosclerotic heart disease of native coronary artery without angina pectoris: Secondary | ICD-10-CM

## 2012-05-14 DIAGNOSIS — E785 Hyperlipidemia, unspecified: Secondary | ICD-10-CM

## 2012-05-14 DIAGNOSIS — R609 Edema, unspecified: Secondary | ICD-10-CM

## 2012-05-14 DIAGNOSIS — I4891 Unspecified atrial fibrillation: Secondary | ICD-10-CM

## 2012-05-14 NOTE — Assessment & Plan Note (Signed)
Not able to tolerate statins. Continue current regimen.

## 2012-05-14 NOTE — Assessment & Plan Note (Signed)
Blood pressure control is good today. 

## 2012-05-14 NOTE — Progress Notes (Signed)
Clinical Summary Mr. Gittins is an 79 y.o.male last seen by me in the office in August 2012. He had a more recent visit with Dr. Johney Frame in February and was noted to have normal device function at that time.  Carotid Dopplers in December 2013 demonstrated a 20-39% RICA stenosis and 0-19% LICA stenosis. This is followed by Dr. Hart Rochester.  He is here with family. Since I last saw him he fell and fractured his hip last year. Still has functional residua from his previous stroke and uses a cane. He remains on Coumadin, followed by Dr. Margo Common. He has been weaned off of Zocor due to muscle aches, feels better per report.  ECG today shows ventricular paced rhythm with underlying atrial fibrillation.  Allergies  Allergen Reactions  . Clopidogrel Bisulfate     REACTION: rash  . Simvastatin     Rash     Current Outpatient Prescriptions  Medication Sig Dispense Refill  . amLODipine (NORVASC) 10 MG tablet Take 10 mg by mouth daily.        . Ascorbic Acid (VITAMIN C) 1000 MG tablet Takes one tab 3 x week       . aspirin 81 MG tablet Take 81 mg by mouth daily.        . carvedilol (COREG) 6.25 MG tablet Take 6.25 mg by mouth 2 (two) times daily with a meal.        . co-enzyme Q-10 30 MG capsule Takes one tab 3 x week       . glimepiride (AMARYL) 4 MG tablet Take 4 mg by mouth daily before breakfast.      . mineral oil liquid As needed.         . nitroGLYCERIN (NITROSTAT) 0.4 MG SL tablet Place 0.4 mg under the tongue every 5 (five) minutes as needed. Do this for up to 3 doses for chest pain. If pain continues, go to ED.       Marland Kitchen Omega-3 Fatty Acids (FISH OIL) 1000 MG CAPS Take by mouth daily.        Marland Kitchen VITAMIN D, CHOLECALCIFEROL, PO Takes one tab 3 x week      . vitamin E 400 UNIT capsule Takes one tab 3 x week       . warfarin (COUMADIN) 5 MG tablet Take 5 mg by mouth daily. Managed by Dr. Margo Common.        No current facility-administered medications for this visit.    Past Medical History    Diagnosis Date  . Coronary atherosclerosis of native coronary artery     Multivessel, LVEF 60%  . Essential hypertension, benign   . Myocardial infarction     NSTEMI  . Atrial fibrillation     Refused Coumadin until recent stroke  . Hearing loss   . Type 2 diabetes mellitus   . Sick sinus syndrome   . Carotid artery disease     Dr. Hart Rochester  . Type 2 diabetes mellitus   . Arthritis   . Stroke     January 2012, left frontoparietal infarct  . Chronic kidney disease     Past Surgical History  Procedure Laterality Date  . Hernia repair    . Appendectomy    . Coronary artery bypass graft  2001    [place] - LIMA to LAD, SVG to OM, SVG to PDA  . Pacemaker placement  2011    [employer] RF SR model [id number]  . Carotid endarterectomy  5/12  Dr. Hart Rochester  . Cholecystectomy      Gall Bladder  . Joint replacement  Aug. 2013    Right Hip    Social History Mr. Massing reports that he has never smoked. He has never used smokeless tobacco. Mr. Pecha reports that he does not drink alcohol.  Review of Systems Hepatitis stable. No reported bleeding episodes. Has chronic edema around the ankles and lower legs. No significant change.  Physical Examination Filed Vitals:   05/14/12 1436  BP: 127/69  Pulse: 60   Filed Weights   05/14/12 1436  Weight: 172 lb (78.019 kg)    Elderly male in no acute distress.  HEENT: Conjunctiva and lids normal, oropharynx clear.  Neck: Supple, no elevated JVP, soft left carotid bruit.  Lungs: Clear to auscultation, nonlabored.  Cardiac: Regular rate and rhythm with occasional ectopic beat, no S3 gallop or rub.  Abdomen: Soft, nontender, bowel sounds present.  Skin: Warm and dry.   Extremities: 2+ edema noted around the ankles, also right hand. Generally worse on the right  Neuropsychiatric: Alert and oriented x3, expressive aphasia, right-sided residual weakness.   Problem List and Plan   ATRIAL FIBRILLATION Ventricular pacing with  normal heart rate. Continues on Coumadin per Dr. Margo Common.  CAD, NATIVE VESSEL No active angina symptoms, no nitroglycerin use. Continue medical therapy and observation.  Edema Chronic, stable. Probably some contribution from Norvasc as well.  ESSENTIAL HYPERTENSION, BENIGN Blood pressure control is good today.  HYPERLIPIDEMIA-MIXED Not able to tolerate statins. Continue current regimen.    Jonelle Sidle, M.D., F.A.C.C.

## 2012-05-14 NOTE — Assessment & Plan Note (Signed)
Chronic, stable. Probably some contribution from Norvasc as well.

## 2012-05-14 NOTE — Assessment & Plan Note (Signed)
No active angina symptoms, no nitroglycerin use. Continue medical therapy and observation.

## 2012-05-14 NOTE — Patient Instructions (Addendum)

## 2012-05-14 NOTE — Assessment & Plan Note (Signed)
Ventricular pacing with normal heart rate. Continues on Coumadin per Dr. Margo Common.

## 2012-12-12 ENCOUNTER — Encounter: Payer: Self-pay | Admitting: Cardiology

## 2012-12-12 ENCOUNTER — Encounter: Payer: Medicare PPO | Admitting: Cardiology

## 2012-12-12 NOTE — Progress Notes (Signed)
Patient canceled visit. This encounter was created in error - please disregard. 

## 2013-01-14 ENCOUNTER — Ambulatory Visit: Payer: Medicare PPO | Admitting: Neurosurgery

## 2013-01-14 ENCOUNTER — Other Ambulatory Visit: Payer: Medicare PPO

## 2013-01-18 ENCOUNTER — Encounter: Payer: Self-pay | Admitting: Family

## 2013-01-21 ENCOUNTER — Ambulatory Visit: Payer: Medicare PPO | Admitting: Family

## 2013-01-21 ENCOUNTER — Other Ambulatory Visit (HOSPITAL_COMMUNITY): Payer: Medicare PPO

## 2013-01-29 ENCOUNTER — Encounter: Payer: Self-pay | Admitting: Family

## 2013-01-30 ENCOUNTER — Ambulatory Visit: Payer: Medicare PPO | Admitting: Family

## 2013-01-30 ENCOUNTER — Other Ambulatory Visit (HOSPITAL_COMMUNITY): Payer: Medicare PPO

## 2013-02-14 ENCOUNTER — Ambulatory Visit: Payer: Medicare PPO | Admitting: Cardiology

## 2013-02-28 ENCOUNTER — Telehealth: Payer: Self-pay | Admitting: Internal Medicine

## 2013-02-28 NOTE — Telephone Encounter (Signed)
PAT[facility] IS NOW H[facility] TO GO [facility] - TRYING TO GET [facility] APPROVE OUR DOCTORS SO THAT THEY CAN RETURN

## 2013-03-04 ENCOUNTER — Encounter: Payer: Medicare PPO | Admitting: Internal Medicine

## 2014-03-18 DEATH — deceased

## 2019-01-13 ENCOUNTER — Encounter: Payer: Self-pay | Admitting: Cardiology
